# Patient Record
Sex: Female | Born: 1950 | Race: White | Hispanic: No | Marital: Married | Smoking: Never smoker
Health system: Southern US, Community
[De-identification: ages and names within clinical notes are randomized; demographics above are authoritative.]

## PROBLEM LIST (undated history)

## (undated) DIAGNOSIS — E079 Disorder of thyroid, unspecified: Secondary | ICD-10-CM

## (undated) DIAGNOSIS — F419 Anxiety disorder, unspecified: Secondary | ICD-10-CM

## (undated) DIAGNOSIS — G4733 Obstructive sleep apnea (adult) (pediatric): Secondary | ICD-10-CM

## (undated) DIAGNOSIS — Z8669 Personal history of other diseases of the nervous system and sense organs: Secondary | ICD-10-CM

## (undated) DIAGNOSIS — E785 Hyperlipidemia, unspecified: Secondary | ICD-10-CM

## (undated) DIAGNOSIS — Z86018 Personal history of other benign neoplasm: Secondary | ICD-10-CM

## (undated) DIAGNOSIS — I340 Nonrheumatic mitral (valve) insufficiency: Secondary | ICD-10-CM

## (undated) DIAGNOSIS — K219 Gastro-esophageal reflux disease without esophagitis: Secondary | ICD-10-CM

## (undated) DIAGNOSIS — I10 Essential (primary) hypertension: Secondary | ICD-10-CM

## (undated) DIAGNOSIS — E669 Obesity, unspecified: Secondary | ICD-10-CM

## (undated) HISTORY — DX: Gastro-esophageal reflux disease without esophagitis: K21.9

## (undated) HISTORY — DX: Personal history of other diseases of the nervous system and sense organs: Z86.69

## (undated) HISTORY — DX: Obstructive sleep apnea (adult) (pediatric): G47.33

## (undated) HISTORY — DX: Disorder of thyroid, unspecified: E07.9

## (undated) HISTORY — DX: Anxiety disorder, unspecified: F41.9

## (undated) HISTORY — DX: Essential (primary) hypertension: I10

## (undated) HISTORY — DX: Obesity, unspecified: E66.9

## (undated) HISTORY — DX: Hyperlipidemia, unspecified: E78.5

## (undated) HISTORY — DX: Nonrheumatic mitral (valve) insufficiency: I34.0

## (undated) HISTORY — DX: Personal history of other benign neoplasm: Z86.018

---

## 2004-01-02 ENCOUNTER — Ambulatory Visit: Payer: Self-pay | Admitting: Internal Medicine

## 2004-01-04 ENCOUNTER — Ambulatory Visit: Payer: Self-pay | Admitting: Obstetrics and Gynecology

## 2004-07-08 ENCOUNTER — Ambulatory Visit: Payer: Self-pay | Admitting: Internal Medicine

## 2004-10-24 ENCOUNTER — Ambulatory Visit: Payer: Self-pay | Admitting: Internal Medicine

## 2004-11-18 ENCOUNTER — Ambulatory Visit: Payer: Self-pay | Admitting: Internal Medicine

## 2005-01-01 ENCOUNTER — Ambulatory Visit: Payer: Self-pay | Admitting: General Surgery

## 2005-01-05 ENCOUNTER — Ambulatory Visit: Payer: Self-pay | Admitting: Obstetrics and Gynecology

## 2005-01-09 ENCOUNTER — Ambulatory Visit: Payer: Self-pay | Admitting: Obstetrics and Gynecology

## 2005-01-29 ENCOUNTER — Ambulatory Visit: Payer: Self-pay | Admitting: Internal Medicine

## 2005-05-26 ENCOUNTER — Ambulatory Visit: Payer: Self-pay | Admitting: Internal Medicine

## 2005-06-23 ENCOUNTER — Ambulatory Visit: Payer: Self-pay | Admitting: Obstetrics and Gynecology

## 2005-09-05 ENCOUNTER — Other Ambulatory Visit: Payer: Self-pay

## 2005-09-05 ENCOUNTER — Emergency Department: Payer: Self-pay | Admitting: Emergency Medicine

## 2006-02-26 ENCOUNTER — Ambulatory Visit: Payer: Self-pay | Admitting: Internal Medicine

## 2006-03-05 ENCOUNTER — Ambulatory Visit: Payer: Self-pay | Admitting: Internal Medicine

## 2006-03-12 ENCOUNTER — Ambulatory Visit: Payer: Self-pay | Admitting: Internal Medicine

## 2006-06-15 ENCOUNTER — Encounter: Payer: Self-pay | Admitting: Rheumatology

## 2006-07-04 ENCOUNTER — Encounter: Payer: Self-pay | Admitting: Rheumatology

## 2006-08-12 ENCOUNTER — Ambulatory Visit: Payer: Self-pay | Admitting: Internal Medicine

## 2006-11-02 ENCOUNTER — Ambulatory Visit: Payer: Self-pay | Admitting: Obstetrics and Gynecology

## 2007-03-03 ENCOUNTER — Ambulatory Visit: Payer: Self-pay | Admitting: Obstetrics and Gynecology

## 2007-03-10 ENCOUNTER — Ambulatory Visit: Payer: Self-pay | Admitting: Cardiology

## 2007-03-10 LAB — CONVERTED CEMR LAB: Pro B Natriuretic peptide (BNP): 8 pg/mL (ref 0.0–100.0)

## 2007-03-18 ENCOUNTER — Ambulatory Visit: Payer: Self-pay

## 2007-04-25 IMAGING — US US THYROID
1 series · 17 of 25 positions shown · non-contrast
Comparison: none

REASON FOR EXAM: Goiter
COMMENTS:

[Series 1: us thyroid · 17 of 28 slices shown]
[im 1/28]
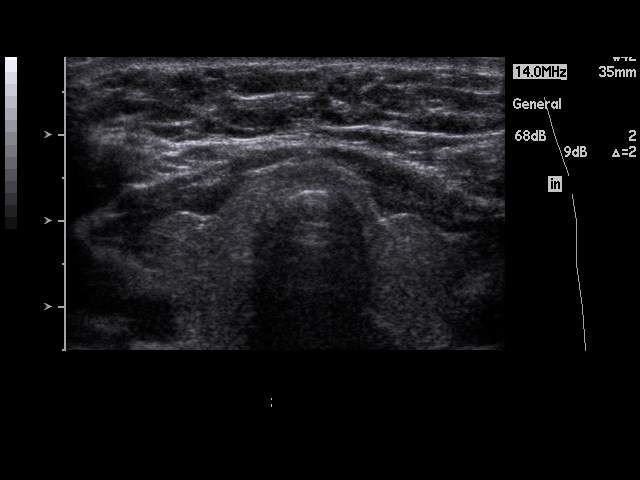
[im 3/28]
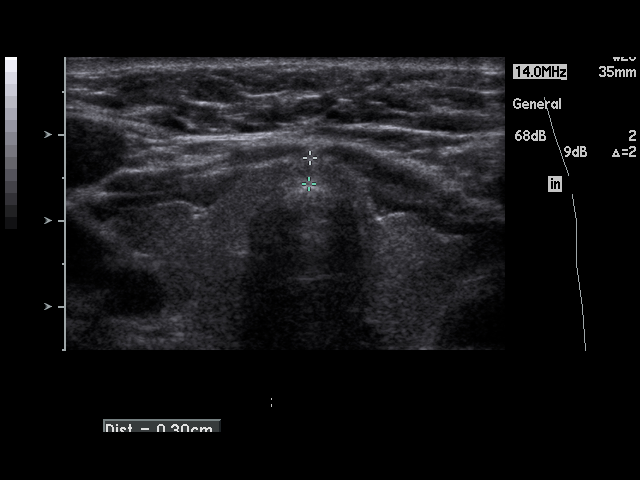
[im 4/28]
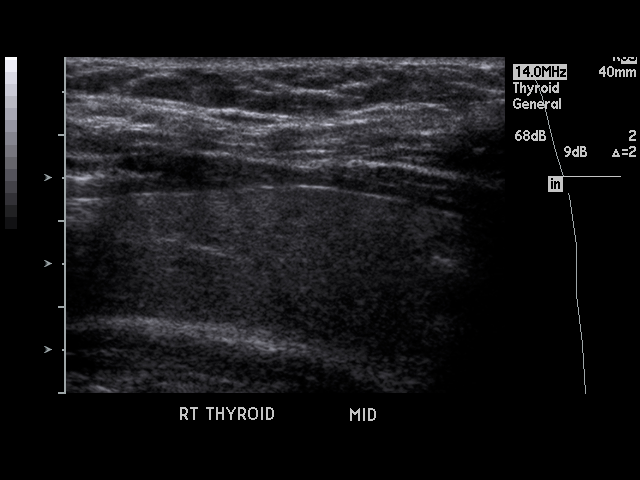
[im 6/28]
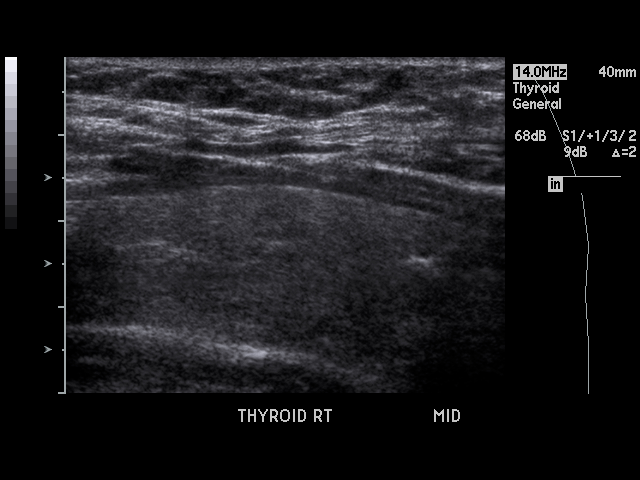
[im 7/28]
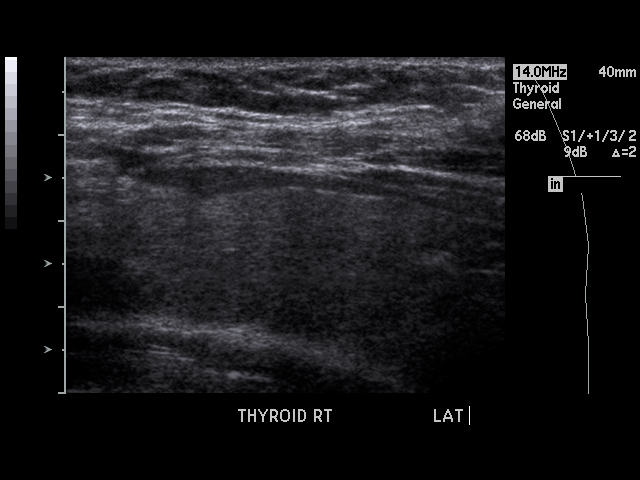
[im 10/28]
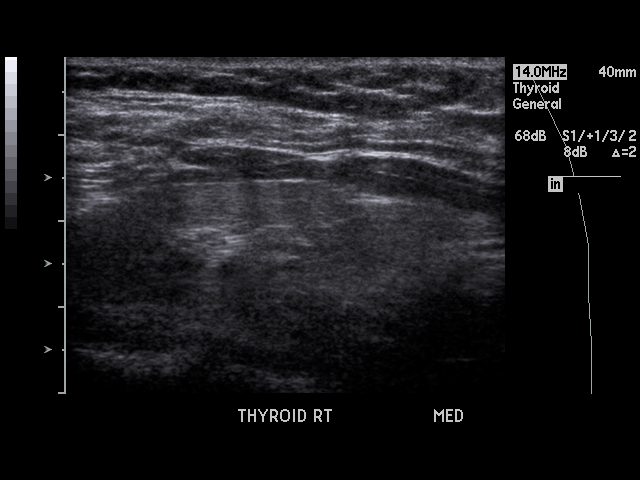
[im 11/28]
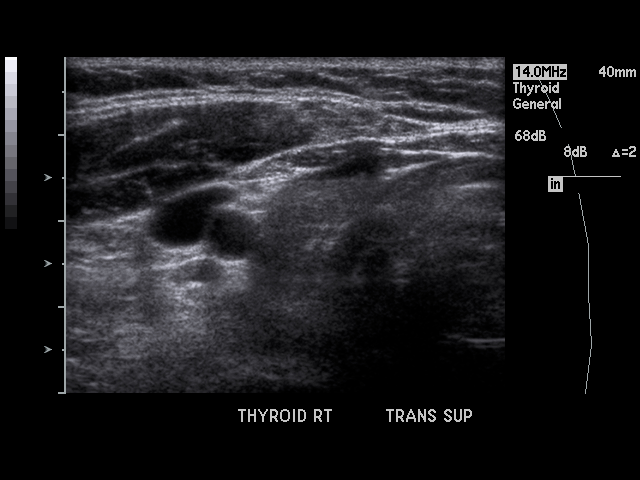
[im 13/28]
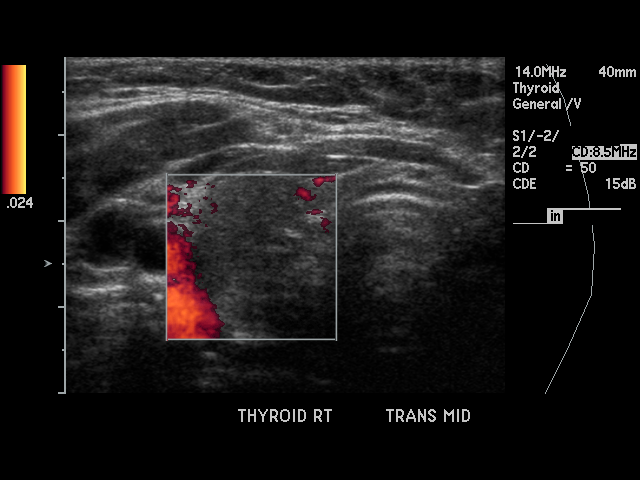
[im 14/28]
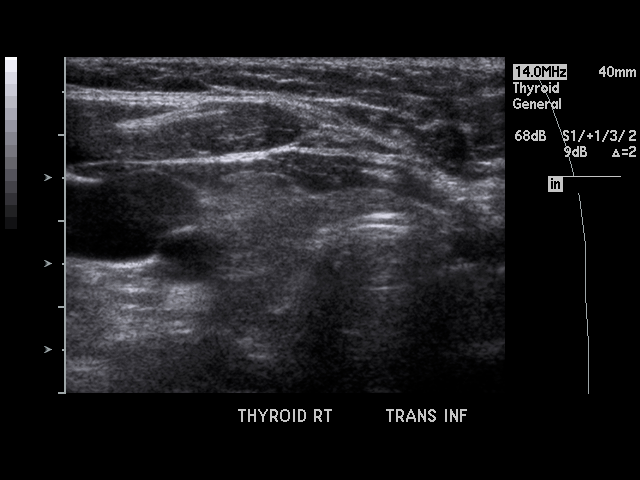
[im 15/28]
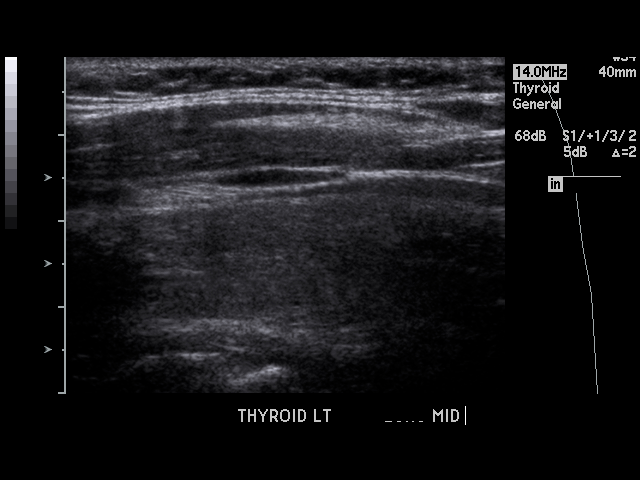
[im 17/28]
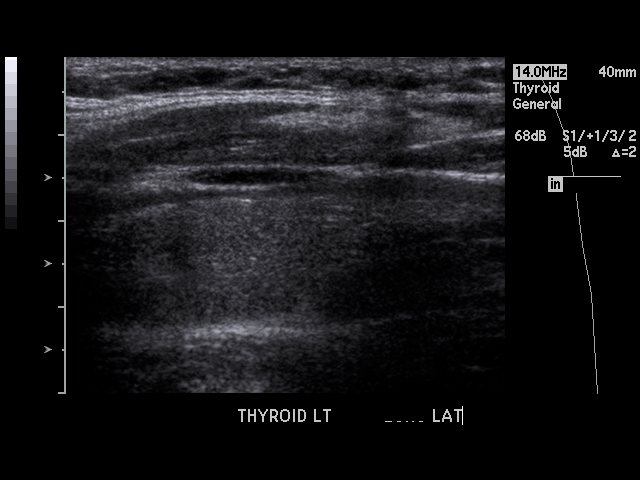
[im 19/28]
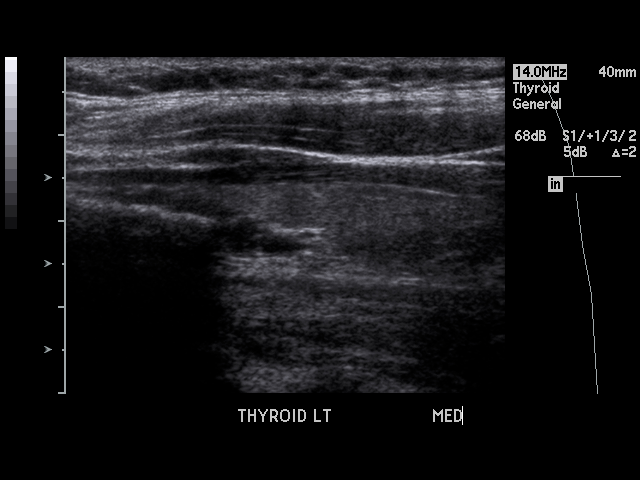
[im 21/28]
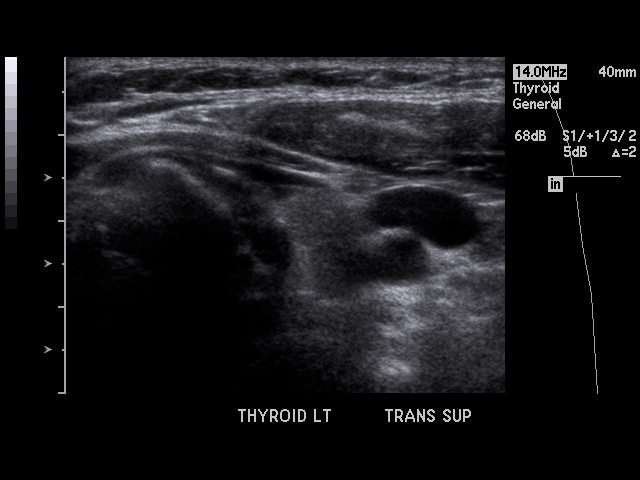
[im 22/28]
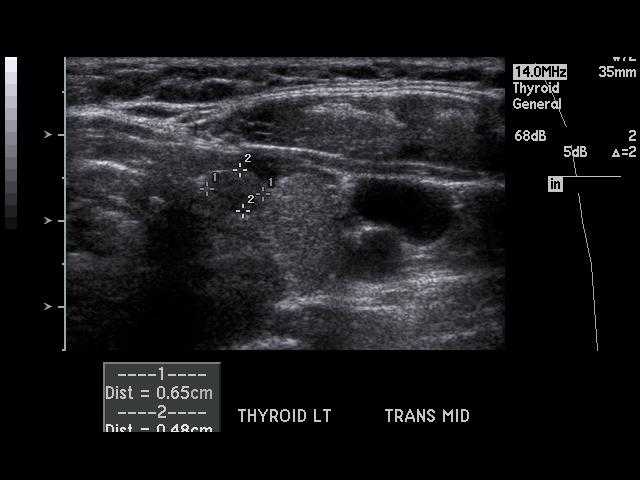
[im 24/28]
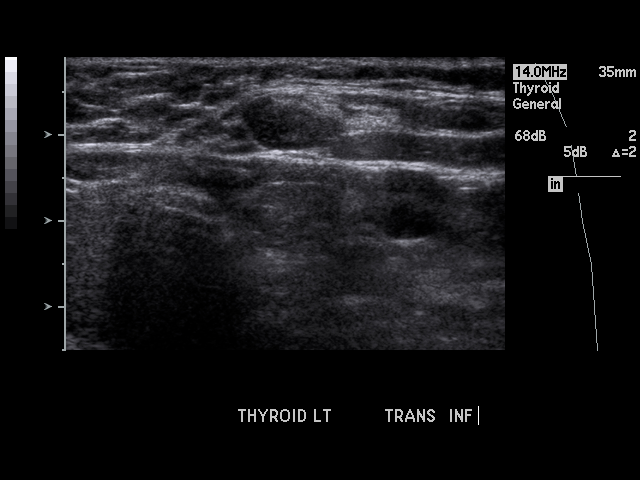
[im 25/28]
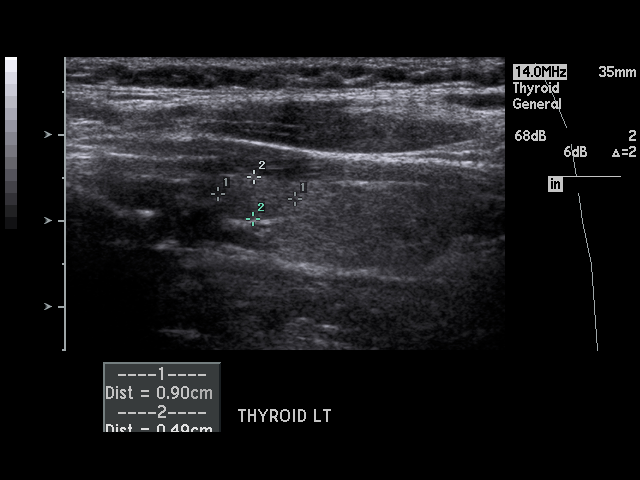
[im 28/28]
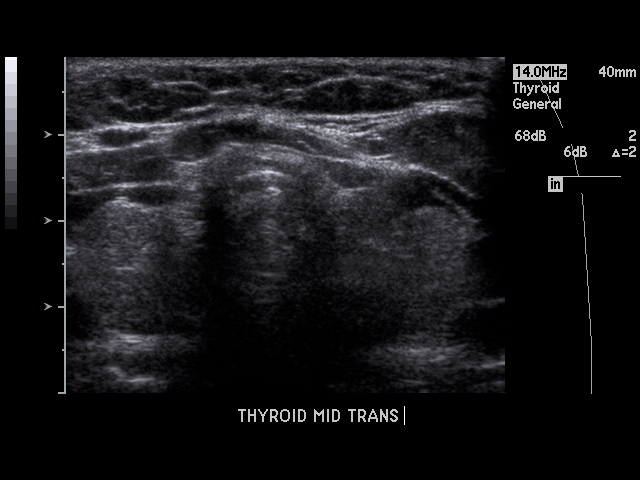

[17 of 25 positions shown; findings below may reference images not displayed]

PROCEDURE:     US  - US THYROID  - January 29, 2005  [DATE]

RESULT:     The RIGHT lobe of the thyroid measures 3.83 cm x 1.67 cm x
cm and the LEFT lobe measures 3.97 cm x 1.50 cm x 1.48 cm.  There is a 9-mm
hypoechoic solid nodule at the upper inner aspect of the LEFT lobe.  No
other nodules are identified.  The echo pattern of the thyroid lobes and
isthmus is homogeneous.
IMPRESSION: There is a 9-mm solid nodule at the upper pole of the LEFT lobe.

Otherwise normal study.

## 2007-08-25 ENCOUNTER — Ambulatory Visit: Payer: Self-pay | Admitting: Nurse Practitioner

## 2007-08-27 ENCOUNTER — Emergency Department: Payer: Self-pay | Admitting: Emergency Medicine

## 2007-11-25 ENCOUNTER — Ambulatory Visit: Payer: Self-pay | Admitting: Cardiovascular Disease

## 2007-11-29 ENCOUNTER — Ambulatory Visit: Payer: Self-pay

## 2007-12-26 ENCOUNTER — Ambulatory Visit: Payer: Self-pay | Admitting: Obstetrics and Gynecology

## 2008-01-30 ENCOUNTER — Ambulatory Visit: Payer: Self-pay | Admitting: Obstetrics and Gynecology

## 2008-02-02 ENCOUNTER — Ambulatory Visit: Payer: Self-pay | Admitting: Internal Medicine

## 2008-05-16 ENCOUNTER — Ambulatory Visit: Payer: Self-pay | Admitting: Internal Medicine

## 2008-06-17 ENCOUNTER — Emergency Department: Payer: Self-pay | Admitting: Emergency Medicine

## 2008-06-17 ENCOUNTER — Emergency Department (HOSPITAL_BASED_OUTPATIENT_CLINIC_OR_DEPARTMENT_OTHER): Admission: EM | Admit: 2008-06-17 | Discharge: 2008-06-18 | Payer: Self-pay | Admitting: Emergency Medicine

## 2008-06-18 ENCOUNTER — Ambulatory Visit: Payer: Self-pay | Admitting: Interventional Radiology

## 2008-07-03 ENCOUNTER — Observation Stay (HOSPITAL_COMMUNITY): Admission: AD | Admit: 2008-07-03 | Discharge: 2008-07-04 | Payer: Self-pay | Admitting: Cardiology

## 2008-07-03 ENCOUNTER — Encounter: Payer: Self-pay | Admitting: Emergency Medicine

## 2008-07-03 ENCOUNTER — Ambulatory Visit: Payer: Self-pay | Admitting: Interventional Radiology

## 2008-07-03 ENCOUNTER — Telehealth: Payer: Self-pay | Admitting: Cardiovascular Disease

## 2008-07-03 ENCOUNTER — Ambulatory Visit: Payer: Self-pay | Admitting: Cardiology

## 2008-07-05 ENCOUNTER — Telehealth: Payer: Self-pay | Admitting: Cardiovascular Disease

## 2008-07-11 ENCOUNTER — Telehealth: Payer: Self-pay | Admitting: Cardiovascular Disease

## 2008-07-12 DIAGNOSIS — E039 Hypothyroidism, unspecified: Secondary | ICD-10-CM | POA: Insufficient documentation

## 2008-07-12 DIAGNOSIS — K219 Gastro-esophageal reflux disease without esophagitis: Secondary | ICD-10-CM

## 2008-07-12 DIAGNOSIS — R079 Chest pain, unspecified: Secondary | ICD-10-CM

## 2008-07-12 DIAGNOSIS — I080 Rheumatic disorders of both mitral and aortic valves: Secondary | ICD-10-CM | POA: Insufficient documentation

## 2008-07-12 DIAGNOSIS — G4733 Obstructive sleep apnea (adult) (pediatric): Secondary | ICD-10-CM

## 2008-07-12 DIAGNOSIS — E785 Hyperlipidemia, unspecified: Secondary | ICD-10-CM | POA: Insufficient documentation

## 2008-07-12 DIAGNOSIS — I10 Essential (primary) hypertension: Secondary | ICD-10-CM

## 2008-07-26 ENCOUNTER — Encounter: Payer: Self-pay | Admitting: Cardiovascular Disease

## 2008-07-26 ENCOUNTER — Ambulatory Visit: Payer: Self-pay | Admitting: Cardiovascular Disease

## 2008-07-30 LAB — CONVERTED CEMR LAB
Calcium: 9.7 mg/dL (ref 8.4–10.5)
Glucose, Bld: 96 mg/dL (ref 70–99)
Sodium: 137 meq/L (ref 135–145)

## 2009-02-26 ENCOUNTER — Ambulatory Visit: Payer: Self-pay

## 2009-04-05 ENCOUNTER — Ambulatory Visit: Payer: Self-pay | Admitting: Cardiovascular Disease

## 2009-04-05 ENCOUNTER — Encounter: Payer: Self-pay | Admitting: Cardiovascular Disease

## 2009-04-05 ENCOUNTER — Telehealth: Payer: Self-pay | Admitting: Cardiovascular Disease

## 2009-04-06 LAB — CONVERTED CEMR LAB
ALT: 14 units/L (ref 0–35)
CO2: 27 meq/L (ref 19–32)
Chloride: 99 meq/L (ref 96–112)
Sodium: 139 meq/L (ref 135–145)
Total Bilirubin: 0.3 mg/dL (ref 0.3–1.2)
Total Protein: 7 g/dL (ref 6.0–8.3)

## 2009-09-30 ENCOUNTER — Telehealth: Payer: Self-pay | Admitting: Cardiovascular Disease

## 2009-10-08 ENCOUNTER — Telehealth: Payer: Self-pay | Admitting: Cardiovascular Disease

## 2009-10-08 DIAGNOSIS — R002 Palpitations: Secondary | ICD-10-CM

## 2009-10-11 ENCOUNTER — Telehealth (INDEPENDENT_AMBULATORY_CARE_PROVIDER_SITE_OTHER): Payer: Self-pay | Admitting: *Deleted

## 2009-11-02 ENCOUNTER — Telehealth (INDEPENDENT_AMBULATORY_CARE_PROVIDER_SITE_OTHER): Payer: Self-pay | Admitting: *Deleted

## 2009-11-29 ENCOUNTER — Ambulatory Visit: Payer: Self-pay | Admitting: Cardiovascular Disease

## 2009-12-11 ENCOUNTER — Telehealth: Payer: Self-pay | Admitting: Cardiovascular Disease

## 2010-03-04 ENCOUNTER — Ambulatory Visit: Payer: Self-pay | Admitting: Obstetrics and Gynecology

## 2010-03-04 NOTE — Assessment & Plan Note (Signed)
Summary: ekg  Nurse Visit   Vital Signs:  Patient profile:   60 year old female Pulse rate:   82 / minute Pulse rhythm:   regular BP sitting:   130 / 72 Cuff size:   large  Allergies: 1)  ! Codeine  Orders Added: 1)  T-Comprehensive Metabolic Panel [[id number]]

## 2010-03-04 NOTE — Progress Notes (Signed)
Summary: Want results sent to pcp  Phone Note Call from Patient Call back at Home Phone [phone]   Caller: Patient Summary of Call: Pt would like her holter monitor results to be sent to Dr.Mark Hyacinth Meeker [phone] Initial call taken by: Judie Grieve,  December 11, 2009 4:05 PM  Follow-up for Phone Call        spoke w/pt, monitor faxed to Dr Hyacinth Meeker at [phone] Meredith Staggers, RN  December 11, 2009 4:09 PM

## 2010-03-04 NOTE — Progress Notes (Signed)
Summary: Holter moniter scheduled  Phone Note Outgoing Call Call back at [facility] Phone [phone]   Call placed by: Stanton Kidney, EMT-P,  October 11, 2009 3:51 PM Action Taken: Phone Call Completed Summary of Call: Holter monitor scheduled 10/30/09.

## 2010-03-04 NOTE — Progress Notes (Signed)
Summary: Skipped Beats  Phone Note Call from Patient Call back at Home Phone [phone]   Caller: Patient Reason for Call: Talk to Nurse Summary of Call: PT C/O OF HEART SKIPPING BEATS.  Initial call taken by: Edman Circle,  September 30, 2009 2:16 PM  Follow-up for Phone Call        I spoke with the pt and she is having skipped beats.  These are happening almost every day and are very random.  The pt does not notice any particular trigger for these skipped beats.  The pt does consume caffeine and has been under stress.  I made her aware that this can make palpitations worse.  I spoke with the pt about wearing a heart monitor.  The pt would like me to speak with Dr Excell Seltzer and see if he would order a heart monitor.    Follow-up by: Julieta Gutting, RN, BSN,  September 30, 2009 6:38 PM  Additional Follow-up for Phone Call Additional follow up Details #1::        Pt had similar symptoms in March - CMET at that time was normal. Recommend 24 hour holter monitor - will likely give empiric trial of Toprol XL but will review Holter first. Additional Follow-up by: Norva Karvonen, MD,  September 30, 2009 8:47 PM    Additional Follow-up for Phone Call Additional follow up Details #2::    I spoke with the pt and made her aware that Dr Excell Seltzer ordered a holter monitor.  The pt said her skipped beats have calmed down since yesterday.  The pt said she failed to mention that she has had a sinus infection and just started a Z-pak yesterday.  I asked her if she had taken any decongestents OTC and she had.  I made her aware that decongestents can cause palpitations and make her symptoms worse.  The pt would like to hold off on monitor at this time since her skipped beats have calmed down.  I asked her to call our office if she had any further problems and we could order monitor at that time.  Pt agreed with plan.  Follow-up by: Julieta Gutting, RN, BSN,  October 01, 2009 2:13 PM

## 2010-03-04 NOTE — Progress Notes (Signed)
Summary: Holter  Phone Note Outgoing Call Call back at [facility] Phone [phone]   Call placed by: Stanton Kidney, EMT-P,  November 02, 2009 12:52 PM Action Taken: Phone Call Completed Summary of Call: S/W Pt,... tried to R/S appt for 24 holter monitor, Pt advised she was not having symptoms as much and does not want to have the monitor at this time. Stanton Kidney, EMT-P  November 02, 2009 12:53 PM      Appended Document: Holter Pt aware of results by phone.  Monitor showed NSR with Rare PVCs.  I spoke with the pt about caffeine, stress and lack of sleep contributing to PVCs.

## 2010-03-04 NOTE — Procedures (Signed)
Summary: summary report  summary report   Imported By: Mirna Mires 12/09/2009 08:45:27  _____________________________________________________________________  External Attachment:    Type:   Image     Comment:   External Document

## 2010-03-04 NOTE — Progress Notes (Signed)
Summary: WOULD LIKE TO GET HOLTER MONITOR  Phone Note Call from Patient Call back at Home Phone [phone]   Caller: PT Reason for Call: Talk to Nurse Summary of Call: PT WANT TO TALK WITH LAUREN ABOUT GETTING HOLTER MONITOR PUT ON. SHE IS STILL HAVING SAME SYMPTOMS. Initial call taken by: Faythe Ghee,  October 08, 2009 3:46 PM  Follow-up for Phone Call        I spoke with the pt and she is having problems with her heart skipping again.  Order placed for 24 hour holter monitor. I made the pt aware that our office will contact her with an appt for monitor placement.  Follow-up by: Julieta Gutting, RN, BSN,  October 08, 2009 3:51 PM  New Problems: PALPITATIONS (ICD-785.1)   New Problems: PALPITATIONS (ICD-785.1)

## 2010-03-04 NOTE — Progress Notes (Signed)
Summary: irregular HR  Phone Note Call from Patient   Summary of Call: pts called to state that over the last several days she has had increasing frequency of irregular heart beats, esp. from 3-5pm.  no chest pain, sob, or other cardiac symptoms.  pt had stopped taking benicar hct for a couple of days because bp was running in the 100's sys.  pt concerned about HR.  please advise.  Initial call taken by: Charlena Cross, RN, BSN,  April 05, 2009 3:31 PM  Follow-up for Phone Call        pt called back to say that she had just had another episode.  pt in tears.  instructed pt that she could come in for ekg but that if she was not having symptoms at that time, it would not show anything.  CMET drawn r/t pt on diuretic and K supplement. Follow-up by: Charlena Cross, RN, BSN,  April 05, 2009 4:32 PM  Additional Follow-up for Phone Call Additional follow up Details #1::        Will review EKG and labs when they are available (please route them to me). We could try her on a beta blocker empirically if palps remain problematic. Additional Follow-up by: Norva Karvonen, MD,  April 06, 2009 2:40 PM    Additional Follow-up for Phone Call Additional follow up Details #2::    pt aware. Charlena Cross RN BSN

## 2010-05-12 LAB — CBC
HCT: 33 % — ABNORMAL LOW (ref 36.0–46.0)
Hemoglobin: 11.4 g/dL — ABNORMAL LOW (ref 12.0–15.0)
MCHC: 34.8 g/dL (ref 30.0–36.0)
MCHC: 34.4 g/dL (ref 30.0–36.0)
MCV: 87.3 fL (ref 78.0–100.0)
Platelets: 279 10*3/uL (ref 150–400)
Platelets: 224 K/uL (ref 150–400)
RBC: 4.4 MIL/uL (ref 3.87–5.11)
RBC: 3.78 MIL/uL — ABNORMAL LOW (ref 3.87–5.11)
RDW: 12 % (ref 11.5–15.5)
RDW: 12.4 % (ref 11.5–15.5)
WBC: 6 K/uL (ref 4.0–10.5)

## 2010-05-12 LAB — HEMOGLOBIN A1C
Hgb A1c MFr Bld: 5.5 % (ref 4.6–6.1)
Mean Plasma Glucose: 111 mg/dL

## 2010-05-12 LAB — BASIC METABOLIC PANEL
BUN: 22 mg/dL (ref 6–23)
CO2: 30 mEq/L (ref 19–32)
Calcium: 9.6 mg/dL (ref 8.4–10.5)
Creatinine, Ser: 1.1 mg/dL (ref 0.4–1.2)
GFR calc Af Amer: >60 mL/min (ref >60)

## 2010-05-12 LAB — LIPID PANEL
HDL: 28 mg/dL — ABNORMAL LOW
Total CHOL/HDL Ratio: 6.3 ratio
Triglycerides: 185 mg/dL — ABNORMAL HIGH
VLDL: 37 mg/dL (ref 0–40)

## 2010-05-12 LAB — CARDIAC PANEL(CRET KIN+CKTOT+MB+TROPI)
Relative Index: INVALID (ref 0.0–2.5)
Total CK: 66 U/L (ref 7–177)
Troponin I: 0.01 ng/mL (ref 0.00–0.06)

## 2010-05-12 LAB — PROTIME-INR
INR: 1 (ref 0.00–1.49)
Prothrombin Time: 13.6 seconds (ref 11.6–15.2)

## 2010-05-12 LAB — POCT CARDIAC MARKERS: Myoglobin, poc: 64.1 ng/mL (ref 12–200)

## 2010-05-12 LAB — BRAIN NATRIURETIC PEPTIDE: Pro B Natriuretic peptide (BNP): 34 pg/mL (ref 0.0–100.0)

## 2010-06-17 NOTE — Assessment & Plan Note (Signed)
[facility] OFFICE NOTE   KIASHA, [name]                        MRN:          [id number]  DATE:11/25/2007                            DOB:          [birth year]    REASON FOR VISIT:  Lower extremity peripheral arterial disease and leg  pain.   HISTORY OF PRESENT ILLNESS:  Ms. Corrigan is a 60 year old woman who has  a history of chronic lower extremity edema.  She underwent vascular  screening at [facility] earlier this week and was  found to have abnormal ABIs.  They were reported at 0.51 on the right  and 0.82 on the left.  She presents today for further evaluation  regarding her concern of lower extremity peripheral arterial disease.   She reports a long-standing history of bilateral calf pain with walking  up hill.  This is a problem that has been slowly progressive over  several years.  She is able to walk on flat ground at a normal pace  without problems.  She denies any pain with that level of activity.  She  has a sedentary job and sits for much of the day.  She has had no  ulcerations or nonhealing wounds on the legs.  She also complains of  chronic leg edema, left greater than right.  This has been slowly  progressive over 15 years.  She wears compression stockings and has had  some improvement with those measures.  She also elevates her legs in the  evening.  She has no history of stroke, myocardial infarction, or  documented cardiovascular disease.   Current medications include:  1. Aspirin 162 mg daily.  2. Benicar HCT 20/12.5 mg one-half daily.  3. CPAP at night.  4. Lipitor 40 mg at bedtime.   Allergies include PAXIL, CODEINE, and DICLOFENAC.   Past medical history includes the following:  1. Dyslipidemia.  2. Obesity.  3. Sleep apnea.  4. Essential hypertension.  5. Headaches.  6. Long-standing edema.  7. Arthritis.   PAST SURGICAL HISTORY:  None.   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  She is married with 3  children.  She lives locally in [place].  She has no history of  tobacco use.  She drinks alcohol on rare occasion.  She is not  physically active.   REVIEW OF SYSTEMS:  Complete review of systems was performed.  No  pertinent positives to report.   FAMILY HISTORY:  Strong history of cardiovascular disease.  Father had  myocardial infarction at age 30, brother had a myocardial infarction in  his 10s.  She has a sister who has had coronary bypass surgery in her  30s.   PHYSICAL EXAMINATION:  GENERAL:  The patient is alert and oriented,  obese woman in no acute distress.  VITAL SIGNS:  Weight is 209 pounds, blood pressure 136/80, heart rate  98, respiratory rate 12.  HEENT:  Normal.  NECK:  Normal carotid upstrokes.  No bruits.  JVP normal.  LUNGS:  Clear bilaterally.  HEART:  Regular rate and rhythm.  No murmurs or gallops.  Apex is  discrete, nondisplaced.  ABDOMEN:  Soft, obese, nontender.  No organomegaly.  No bruits.  EXTREMITIES:  No clubbing, cyanosis, or edema.  Femoral pulses are 2+.  Dorsalis pedis pulses are 2+.  I cannot palpate posterior tibial pulses.  SKIN:  Warm and dry.  No rash.  NEUROLOGIC:  Cranial nerves II through XII are intact.  Strength intact  and equal.   EKG shows sinus rhythm and is within normal limits with a heart rate of  98 beats per minute.   ABIs as detailed above, 0.51 on the right, 0.82 on the left.   ASSESSMENT:  This is a 60 year old woman with a background of  hypertension and hyperlipidemia who presents for evaluation of abnormal  screening ABIs.  Her physical exam is suggestive of normal arterial  flow.  She has strong pedal pulses in the dorsalis pedis position.  I am  skeptical of her screening ABI results.  We will do formal ABIs in the  office with an arterial physiologic study.  If these are  abnormal, we  would recommend proceeding with lower extremity duplex scan.  I  reassured the patient regarding her vascular status.  We will make final  recommendations based on the results of her formal ABI and physiologic  study.  Certainly, she warrant some aggressive risk reduction measures  in the setting of her strong family history.  Her hypertension and  hypercholesterolemia are being treated aggressively by Dr. Hyacinth Meeker.  She  is on a good dose of a statin medication, and her blood pressure appears  to be well controlled on the combination of ACE inhibitor and diuretic.  She also is on antiplatelet therapy with aspirin.  I made no  recommendations for any changes in her medications.   We will plan on seeing Ms. Oliveto back on a p.r.n. basis unless her  ABIs show abnormal findings.  I appreciate the opportunity to see this  very nice patient.     Veverly Fells. Excell Seltzer, MD  Electronically Signed    MDC/MedQ  DD: 11/25/2007  DT: 11/26/2007  Job #: [id number]   cc:   Bethann Punches
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
## 2010-06-17 NOTE — Discharge Summary (Signed)
[name], [name] NO.:  [id number]   MEDICAL RECORD NO.:  [id number]          PATIENT TYPE:  INP   LOCATION:  2011                         FACILITY:  [facility]   PHYSICIAN:  Veverly Fells. Excell Seltzer, MD  DATE OF BIRTH:  26-Sep-1950   DATE OF ADMISSION:  07/03/2008  DATE OF DISCHARGE:  07/04/2008                               DISCHARGE SUMMARY   PRIMARY CARDIOLOGIST:  Veverly Fells. Excell Seltzer, MD   DISCHARGE DIAGNOSIS:  Noncardiac chest pain.   SECONDARY DIAGNOSES:  1. Hypertension.  2. Likely hypothyroidism (TSH equal to 8.011).  3. Dyslipidemia.  4. Gastroesophageal reflux disease.  5. Mild mitral regurgitation per echo in 2010.  6. Obstructive sleep apnea, on CPAP.   ALLERGIES:  1. DICLOFENAC.  2. CODEINE.  3. PAROXETINE.   PROCEDURES PERFORMED DURING THIS HOSPITALIZATION:  1. EKG showing sinus rhythm at a rate of 93, minimal T wave inversion      in V1 and T wave flattening in V2 and V3 as well as aVL.      Otherwise, no acute ST - T wave changes, no significant Q waves,      normal axis, no evidence of hypertrophy.  PR 176, QRS 74, and QTc      443.  2. Chest x-ray completed on July 03, 2008, showing no active disease.  3. The patient had EKG on July 04, 2008, with no significant changes      from prior tracing, and the patient had cardiac catheterization on      July 04, 2008, showing normal coronary arteries and a normal left      ventricular function.   HISTORY OF PRESENT ILLNESS:  [name] is a 60 year old Caucasian  female with no known history of coronary artery disease, but history  significant for hypertension, dyslipidemia, GERD, presenting to the  [facility] Emergency Department with complaint of chest pain.  Previously, seen by Dr. Excell Seltzer for lower extremity edema.  Recently, she  reports shortness of breath, and had a 2-D echocardiogram completed,  which only showed mild mitral regurgitation.  She reports having a  nuclear stress test 2 years ago,  reportedly normal.  Around 10:30 in  a.m. on July 03, 2008, the patient developed substernal chest pain over  the left breast, onset at rest, described as sharp with radiation to  left shoulder blade and left arm.  This was intermittent for  approximately 2 hours resolving on its own.  By the time, she was  evaluated in the ER, she was pain free, currently denying shortness of  breath, nausea, or diaphoresis (at any time).   HOSPITAL COURSE:  The patient admitted and underwent procedures as  described above.  She tolerated them well without any significant  complications.  The patient underwent cardiac catheterization secondary  to ongoing symptoms, significant family history, and severe anxiety  regarding symptomatology.  The patient's vital signs remained stable  during her brief hospital course except for mild hypotension on the  morning of July 04, 2008.  At that time, she was asymptomatic and her  nitroglycerin was discontinued.  Hypertension  subsequently resolved.  Unfortunately, the patient symptoms did return later in the a.m.   After postcath orders were completed, the patient was deemed stable for  discharge.  Secondary to normal findings, no medication changes were  made and no further cardiac workup is needed at this time.  The patient  will follow up with Dr. Excell Seltzer on June 24, at 11:45 a.m.  The patient  will receive her medication list (unchanged), followup instructions, and  postcath instructions at the time of discharge.  All her questions or  concerns will be addressed at that time.   The patient was encouraged to follow up with primary care [name]  regarding elevated TSH.   DISCHARGE LABORATORIES:  WBC 6.0, HGB 11.4, HCT 33.0, and PLT count 224.  Protime 13.6 and INR 1.0.  Hemoglobin A1c 5.5.  The patient had 1 set of  negative point-of-care markers and 3 full sets of cardiac enzymes that  were negative.  BNP was 34.0.  Total cholesterol 177, triglycerides 185,   HDL 28, LDL 112, and VLDL 37.  TSH was 8.011.   FOLLOWUP PLANS AND APPOINTMENTS:  See hospital course.   DISCHARGE MEDICATIONS:  1. Klor-Con 10 mEq p.o. daily.  2. Lipitor 20 mg p.o. daily.  3. Torsemide 10 mg p.o. daily.  4. Benicar 20 mg p.o. daily and HCTZ 12.5 mg p.o. daily.  5. Diazepam 10 mg p.o. daily p.r.n. for anxiety.   DURATION OF DISCHARGE/ENCOUNTER INCLUDING PHYSICIAN TIME:  35 minutes.      Jarrett Ables, [facility]      Veverly Fells. Excell Seltzer, MD  Electronically Signed    MS/MEDQ  D:  07/04/2008  T:  07/05/2008  Job:  [id number]

## 2010-06-17 NOTE — Assessment & Plan Note (Signed)
[facility] OFFICE NOTE   [name], [name]                        MRN:          [id number]  DATE:03/10/2007                            DOB:          01-06-1951    The primary is Dr. Bethann Punches.   REASON FOR PRESENTATION:  Evaluate patient with arm discomfort,  shortness of breath and swelling.   HISTORY OF PRESENT ILLNESS:  The patient is a pleasant 60 year old white  female who has had chronic lower extremity swelling and what sounds like  more diffuse edema for some time.  She has seen Dr. Guinevere Scarlet.  There has  been a question of thoracic outlet syndrome.  There has also been a  proposed diagnosis of lower extremity lymphedema.  She sounds like she  has had a stress test a few years ago.  She had an echocardiogram a few  years ago.  These were apparently normal, though I do not have these  reports.   She presents now with shortness of breath.  This has been going on for  perhaps a year or more.  However, it is progressive.  She is now getting  short of breath with moderate activities.  She is not describing resting  shortness of breath.  She has not been describing PND or orthopnea.  She  has sleep apnea and sleeps with a CPAP machine and actually does well  with this.  She has had chronic lower extremity swelling and does not  think that has changed.  She does describe discomfort in her arm.  This  is new.  This has been going on for a couple of months.  This seems to  be getting worse.  She describes a discomfort that goes down into her  hands and to her nail beds.  It is both arms, left greater than right.  She does have some discomfort up into her jaw as well.  This happens at  rest.  It will last for only a few seconds.  It is not reproducible with  activity.  However, again, she is not particularly active other than  climbing stairs, sometimes with groceries.  It goes away spontaneously.  There is no  associated nausea, vomiting or diaphoresis.   PAST MEDICAL HISTORY:  1. Dependent edema (questionable lymphedema).  2. Questionable thoracic outlet syndrome.  3. Dyslipidemia times years.  4. Allergic rhinitis.  5. Sleep apnea.  6. Obesity.  7. Hypertension x6 years.  8. Migraines.  9. Inflammatory arthritis.   PAST SURGICAL HISTORY:  None.   ALLERGIES:  Intolerances to Paxil, codeine, diclofenac.   MEDICATIONS:  1. Aspirin 162 mg daily.  2. Benicar/HCT 20/12.5 mg 1/2 tablet daily.  3. CPAP.  4. Lescol 80 mg nightly  5. Valium 5 mg p.r.n.  6. Celexa.  7. Ibuprofen.  8. Furosemide 20 mg p.r.n.   SOCIAL HISTORY:  The patient is a Museum/gallery exhibitions officer.  She likes  swimming.  She has three children living.  She has one deceased child.   FAMILY HISTORY:  Contributory for her mother alive with a  bypass.  She  has a brother with heart disease with a myocardial function at age 18  and sister with a myocardial infarction at age 41.   REVIEW OF SYSTEMS:  As stated in HPI and otherwise positive for  lightheadedness sometimes during the day, weakness, cough, occasional  palpitations, low back pain, left rotator cuff pain.  Negative for other  systems.   PHYSICAL EXAMINATION:  The patient is well-appearing, in no distress.  Blood pressure 142/66, heart rate 88 and regular, weight 206 pounds,  body mass index 39.  HEENT:  Eyelids unremarkable.  Pupils equal, round and reactive to  light, fundi not visualized, oral mucosa unremarkable.  NECK:  No jugular distention at 45 degrees, carotid upstroke brisk and  symmetrical.  No bruits, no thyromegaly.  LYMPHATICS:  No cervical, axillary or inguinal adenopathy.  LUNGS:  Clear to auscultation bilaterally.  BACK:  No costovertebral angle tenderness.  CHEST:  Unremarkable.  HEART:  PMI not displaced or sustained, S1 and S2 within normal limits.  No S3, no S4.  No clicks, no rubs, no murmurs.  ABDOMEN:  Obese, positive bowel sounds,  normal in frequency and pitch.  No bruits, no rebound, no guarding, no midline pulsatile mass.  No  hepatomegaly, no splenomegaly.  SKIN:  No rashes, no nodules.  EXTREMITIES:  2+ pulses throughout, no edema, no cyanosis, no clubbing.  NEUROLOGIC:  Oriented to person, place and time.  Cranial nerves II-XII  grossly intact.  Motor grossly intact.   ASSESSMENT/PLAN:  1. Arm discomfort and dyspnea.  The arm discomfort is atypical.  The      dyspnea could be multifactorial.  However, she has a very strong      family history.  I think she needs to be screened with a stress      perfusion study as the pretest probability is somewhat moderate for      obstructive coronary disease..  I will also check a BMP level.  If      this is very low, then the chance that her pulp her breathing      disorder is related to a cardiac etiology is low.  At that point I      might suggest cardiopulmonary stress testing (CPX) versus pulmonary      function testing to evaluate her dyspnea.  2. Lower extremity edema.  This has apparently been evaluated with an      echocardiogram.  I do not suspect a cardiac etiology or even      necessarily a primary venous problem.  It is probably related to      weight.  The BNP will help sort this out.  She does abuse salt      apparently.  I told her this is absolutely the first thing she      needs to do to see if her edema resolves.  Hopefully, she can      comply with this.  3. Obesity.  This certainly is contributing to multiple complaints.      Weight loss will be fundamental.  4. Sleep apnea per Dr. Hyacinth Meeker.  She seems to be compliant with her      CPAP.  5. Hypertension.  Blood pressure is slightly elevated.  She self-      medicates a little bit with doses of her drugs.  She reports good      blood pressure control.  6. Dyslipidemia.  Her LDL was 180.  She is due to  see Dr. Hyacinth Meeker.  I      suspect he will change her medicines.     Rollene Rotunda, MD, [facility]   Electronically Signed    JH/MedQ  DD: 03/10/2007  DT: 03/11/2007  Job #: [id number]   cc:   Bethann Punches

## 2010-06-17 NOTE — H&P (Signed)
[name], [name] NO.:  [id number]   MEDICAL RECORD NO.:  [id number]          PATIENT TYPE:  INP   LOCATION:  2011                         FACILITY:  [facility]   PHYSICIAN:  Armanda Magic, M.D.     DATE OF BIRTH:  Aug 20, 1950   DATE OF ADMISSION:  07/03/2008  DATE OF DISCHARGE:                              HISTORY & PHYSICAL   CHIEF COMPLAINT:  Chest pain.   HISTORY OF PRESENT ILLNESS:  This is a 60 year old female with a history  of hypertension, dyslipidemia, GERD who presents to the emergency room  complaints of chest pain.  She has been seen by Dr. Excell Seltzer in the past  for lower extremity edema.  Recently she has been having shortness of  breath and a 2-D echocardiogram showed mild MR.  She had a nuclear  stress test 2 years ago, which reportedly was normal.  She says around  10:30 this morning while sitting down typing, she developed substernal  chest pain over the left breast, described as sharp with radiation to  her left shoulder blade and left arm.  This was off and on for 2 hours  and resolved on its own.  By the time she got to the ER, she was pain  free.  She denied any shortness of breath, nausea, or diaphoresis.   PAST MEDICAL HISTORY:  1. Hypertension.  2. Dyslipidemia.  3. GERD.  4. Mild MR by echo in 2010.  5. Obstructive sleep apnea using CPAP.   PAST SURGICAL HISTORY:  None.   SOCIAL HISTORY:  She is married.  She has 3 living children, alive and  well.  She has 1 son who died at 38 of an MVA.  She denies any tobacco or  alcohol use.   FAMILY HISTORY:  Her mother died of CAD.  Her father died of CAD and  renal failure.  She has 2 sisters one of which had CABG in late 44.  She  has 2 brothers, one of which had an MI at 25.   MEDICATIONS:  1. Lipitor 20 mg a day.  2. Aspirin 81 mg 2 tablets daily.  3. Torsemide 10 mg daily.  4. Potassium 10 mEq a day.  5. Benicar, she has recently stopped.  6. Lunesta 2 mg at bedtime and p.r.n.  7.  CPAP.   REVIEW OF SYSTEMS:  Other that what is stated in the HPI is negative  except for episodic jaw pain.   PHYSICAL EXAMINATION:  VITAL SIGNS:  Blood pressure is 143/82, heart  rate 87, she is afebrile.  GENERAL:  She is a well-developed, well-nourished obese white female in  no acute distress.  HEENT:  Benign.  NECK:  Supple without lymphadenopathy.  Carotid upstrokes +2  bilaterally.  No bruits.  LUNGS:  Clear to auscultation throughout.  HEART:  Regular rate and rhythm.  No murmurs, rubs, or gallops.  Normal  S1 and S2.  ABDOMEN:  Soft, nontender, and nontender.  Normoactive bowel sounds.  No  hepatosplenomegaly.  EXTREMITIES:  Edema +1 bilaterally.   EKG shows normal sinus rhythm with nonspecific ST  abnormality.   LABORATORY DATA:  Sodium 143, potassium 3.7, chloride 100, CO2 of 30,  BUN 22, creatinine 1.1.  White cell count 6.7, hemoglobin 13.3,  hematocrit 38, platelet count 279.  CPK-MB less than 1, troponin less  than 0.05.  Myoglobin 64.  Chest x-ray shows no active disease.   IMPRESSION:  1. Chest pain syndrome with atypical and typical components.  Initial      point of care markers are negative.  EKG is nonischemic.  She is      currently pain free.  2. Hypertension, which is stable.  3. Gastroesophageal reflux disease.  4. Dyslipidemia.  5. Mild mitral regurgitation.  6. Lower extremity edema.   PLAN:  Admit to telemetry bed.  Rule out MI with serial cardiac enzymes.  Subcu Lovenox per pharmacy.  IV nitroglycerin drip.  N.p.o. after  midnight.  Nuclear stress test in a.m.  If enzymes negative, D-dimer and  BNP will be checked tonight.      Armanda Magic, M.D.  Electronically Signed     TT/MEDQ  D:  07/03/2008  T:  07/04/2008  Job:  [id number]   cc:   Veverly Fells. Excell Seltzer, MD

## 2010-07-07 ENCOUNTER — Telehealth: Payer: Self-pay | Admitting: Cardiovascular Disease

## 2010-07-07 ENCOUNTER — Telehealth: Payer: Self-pay | Admitting: *Deleted

## 2010-07-07 NOTE — Telephone Encounter (Signed)
Pt calls b/c at least 1 time a week around 3-4 am. " I massage my carotids and it goes away". She does not experience this during the day.  She will reduce her caffeine intake. Pt also states that sometimes "it is like a quiver".  She is wearing her cpap machine.  No other symptoms noted.   She would like Dr. Excell Seltzer to know.   Mylo Red RN

## 2010-07-07 NOTE — Telephone Encounter (Signed)
Patient wake up around 3-4 in am - C /O rapid heart beat,

## 2010-07-09 NOTE — Telephone Encounter (Signed)
Error in telephone call Mylo Red RN

## 2010-07-10 ENCOUNTER — Telehealth: Payer: Self-pay | Admitting: Cardiovascular Disease

## 2010-07-10 NOTE — Telephone Encounter (Signed)
Patient has been waking up during the night around 3-4 am with palpitations. She does wear a CPAP machine @ night. She hasn't had a sleep study in about 5-6 years. She does not have a full face mask. She was diaphoretic during one of these episodes with tachycardia. She did some deep breathing with carotid massage that lasted about 2 minutes. During the day his BP was 120/72 HR 95. She denies caffeine. I will route this to Dr. Excell Seltzer for his opinion.

## 2010-07-10 NOTE — Telephone Encounter (Signed)
Pt was having arrythmia's that wake her up and she called a couple of days ago and she still has not heard anything so she was calling to f/u

## 2010-07-11 NOTE — Telephone Encounter (Signed)
The patient has not been seen here in 2 years. She did have a Holter monitor last year which was unremarkable. She probably needs a followup visit for full evaluation.

## 2010-07-11 NOTE — Telephone Encounter (Signed)
I spoke with the pt and made her aware that Dr Excell Seltzer would like her to schedule a follow-up appointment.  The pt said she would call back once she had her calendar and make an appointment.  I did make her aware that at this time Dr Earmon Phoenix first available is 07/29/10 or she can see our PA in the office.

## 2010-07-21 ENCOUNTER — Encounter: Payer: Self-pay | Admitting: Cardiovascular Disease

## 2010-07-31 ENCOUNTER — Encounter: Payer: Self-pay | Admitting: Physician Assistant

## 2010-07-31 ENCOUNTER — Ambulatory Visit (INDEPENDENT_AMBULATORY_CARE_PROVIDER_SITE_OTHER): Payer: 59 | Admitting: Physician Assistant

## 2010-07-31 DIAGNOSIS — I10 Essential (primary) hypertension: Secondary | ICD-10-CM

## 2010-07-31 DIAGNOSIS — G4730 Sleep apnea, unspecified: Secondary | ICD-10-CM

## 2010-07-31 DIAGNOSIS — K219 Gastro-esophageal reflux disease without esophagitis: Secondary | ICD-10-CM

## 2010-07-31 DIAGNOSIS — R002 Palpitations: Secondary | ICD-10-CM

## 2010-07-31 LAB — BASIC METABOLIC PANEL
BUN: 21 mg/dL (ref 6–23)
Chloride: 100 mEq/L (ref 96–112)
Creatinine, Ser: 0.8 mg/dL (ref 0.4–1.2)
GFR: 74.02 mL/min (ref >60.00)
Glucose, Bld: 97 mg/dL (ref 70–99)

## 2010-07-31 LAB — MAGNESIUM: Magnesium: 2.3 mg/dL (ref 1.5–2.5)

## 2010-07-31 NOTE — Assessment & Plan Note (Signed)
With her history of hypertension and sleep apnea, I would be concerned that she may have been having paroxysms of atrial fibrillation.  I recommended that she proceed with a 2-D echocardiogram and an event monitor.  We will also get a basic metabolic panel, magnesium and TSH today.  She will followup with Dr. Excell Seltzer in 6-8 weeks.

## 2010-07-31 NOTE — Assessment & Plan Note (Signed)
Controlled.  Continue current therapy.  

## 2010-07-31 NOTE — Assessment & Plan Note (Addendum)
She has a symptom of dysphagia.  She has been advised to followup with her PCP.  She may need to see a gastroenterologist.

## 2010-07-31 NOTE — Progress Notes (Signed)
History of Present Illness: Primary Cardiologist: Dr. Tonny Bollman  [name] is a 60 y.o. female who underwent cardiac cath in 07/2008 to evaluate chest pain. She has a background of multiple CV risk factors, most notable for a strong family history of premature CAD.  Cardiac cath showed minimal CAD (essentially normal coronaries) with normal LV function.  She presents today with complaints of palpitations that occurred about 2 weeks ago.  They awoke her from sleep.  It felt like her heart was vibrating.  She actually did a Valsalva maneuver with resolution of her symptoms.  This happened on a couple of occasions.  Her husband told her to change the face mask that she used for her CPAP. After doing this, she has not had any recurrence of symptoms.  She thought that she may have been a little short of breath with this.  Otherwise she denies any chest pain.  She has had some left jaw pain.  This is nonexertional.  She denies orthopnea or PND.  She has chronic pedal edema.  She denies syncope.  She does note an elevated heart rate when she got in the heat.   Past Medical History  Diagnosis Date  . Chest pain     Cardiac catheterization 6/10: Normal, EF 65%  . Mitral regurgitation     mild  . Hypertension     mixed  . Obstructive sleep apnea   . GERD (gastroesophageal reflux disease)   . Hypothyroidism     Current Outpatient Prescriptions  Medication Sig Dispense Refill  . aspirin 81 MG tablet Take 162 mg by mouth daily.        Marland Kitchen atorvastatin (LIPITOR) 40 MG tablet Take 20 mg by mouth daily.       . diazepam (VALIUM) 10 MG tablet Take 2.5 mg by mouth every 6 (six) hours as needed.        . potassium chloride (KLOR-CON) 10 MEQ CR tablet Take 10 mEq by mouth daily.        Marland Kitchen torsemide (DEMADEX) 10 MG tablet Take 10 mg by mouth daily.        Marland Kitchen DISCONTD: olmesartan-hydrochlorothiazide (BENICAR HCT) 20-12.5 MG per tablet Take 0.5 tablets by mouth daily.          Allergies: Allergies    Allergen Reactions  . Codeine   . Diflucan     dizzy    Social history:  Nonsmoker  ROS:  See history of present illness.  She denies fevers, chills, cough, melena, hematochezia.  She does report a history of dysphagia.  She also reports a history of leg pain.  She notes constipation.  All other systems reviewed and negative.  Vital Signs: BP 112/68  Pulse 87  Ht 5\' 2"  (1.575 m)  Wt 209 lb (94.802 kg)  BMI 38.23 kg/m2  PHYSICAL EXAM: Well nourished, well developed, in no acute distress HEENT: normal Neck: no JVD Endocrine: No thyromegaly Vascular: No carotid bruits Cardiac:  normal S1, S2; RRR; no murmur Lungs:  clear to auscultation bilaterally, no wheezing, rhonchi or rales Abd: soft, nontender, no hepatomegaly Ext: Trace to 1+ nonpitting edema bilaterally Skin: warm and dry Neuro:  CNs 2-12 intact, no focal abnormalities noted  EKG:  Sinus rhythm, heart rate 87, normal axis, no acute changes  ASSESSMENT AND PLAN:

## 2010-07-31 NOTE — Patient Instructions (Addendum)
Your physician recommends that you schedule a follow-up appointment in: 6-8 weeks with Dr. Excell Seltzer as per [facility], PA-C.  Your physician recommends that you return for lab work in: TODAY BMET, MAGNESIUM, TSH 785.1  Your physician has requested that you have an echocardiogram 785.1 PT WANTS TO HAVE THIS DONE AFTER SHE RETURNS FROM VACATION, THIS IS OK AS PER SCOTT WEAVER, PA-C. Echocardiography is a painless test that uses sound waves to create images of your heart. It provides your doctor with information about the size and shape of your heart and how well your heart's chambers and valves are working. This procedure takes approximately one hour. There are no restrictions for this procedure.  Your physician has recommended that you wear an event monitor 785.1 PT WANTS TO HAVE THIS DONE AFTER SHE RETURNS FROM VACATION, THIS IS OK PER SCOTT WEAVER, PA-C. Event monitors are medical devices that record the heart's electrical activity. Doctors most often [place] these monitors to diagnose arrhythmias. Arrhythmias are problems with the speed or rhythm of the heartbeat. The monitor is a small, portable device. You can wear one while you do your normal daily activities. This is usually used to diagnose what is causing palpitations/syncope (passing out).   FOLLOW UP YOUR PRIMARY CARE PHYSICIAN FOR TROUBLE SWALLOWING

## 2010-08-01 ENCOUNTER — Ambulatory Visit (HOSPITAL_COMMUNITY): Payer: 59 | Attending: Cardiovascular Disease | Admitting: Radiology

## 2010-08-01 DIAGNOSIS — E669 Obesity, unspecified: Secondary | ICD-10-CM | POA: Insufficient documentation

## 2010-08-01 DIAGNOSIS — I079 Rheumatic tricuspid valve disease, unspecified: Secondary | ICD-10-CM | POA: Insufficient documentation

## 2010-08-01 DIAGNOSIS — I059 Rheumatic mitral valve disease, unspecified: Secondary | ICD-10-CM | POA: Insufficient documentation

## 2010-08-01 DIAGNOSIS — R002 Palpitations: Secondary | ICD-10-CM

## 2010-09-01 ENCOUNTER — Encounter: Payer: Self-pay | Admitting: Orthopedic Surgery

## 2010-09-01 ENCOUNTER — Encounter: Payer: Self-pay | Admitting: Cardiovascular Disease

## 2010-09-02 ENCOUNTER — Encounter (INDEPENDENT_AMBULATORY_CARE_PROVIDER_SITE_OTHER): Payer: 59

## 2010-09-02 DIAGNOSIS — R002 Palpitations: Secondary | ICD-10-CM

## 2010-09-03 ENCOUNTER — Encounter: Payer: Self-pay | Admitting: Orthopedic Surgery

## 2010-09-24 ENCOUNTER — Telehealth: Payer: Self-pay | Admitting: Cardiovascular Disease

## 2010-09-24 NOTE — Telephone Encounter (Signed)
Patient has been having palpitations with left arm pain. She has also had intermittent jaw pain.This pain comes & goes. She is currently wearing an event monitor. This mainly happens during the night. She does wear a CPAP machine at night. I reviewed her event monitor and did not see any indication for her palpitations. She has one more week to wear the monitor. Instructed her to wear the monitor at all times & to decrease her intake of caffeine. She is due to f/u with Dr. Excell Seltzer on 10/02/10. Tereso Newcomer, PA recently saw Ms. Eskelson in June & ordered an echo and the monitor. She would like his opinion regarding these symptoms. I will route to him since Dr. Excell Seltzer is currently on vacation.

## 2010-09-24 NOTE — Telephone Encounter (Signed)
Patient calling having left arm pain for about a couples of months.  worse at night. Pt going to physical therapy.

## 2010-09-24 NOTE — Telephone Encounter (Signed)
Let me see her monitor. Tereso Newcomer, PA-C

## 2010-09-26 ENCOUNTER — Telehealth: Payer: Self-pay | Admitting: Physician Assistant

## 2010-09-26 MED ORDER — METOPROLOL SUCCINATE ER 25 MG PO TB24: 25.0000 mg | ORAL_TABLET | Freq: Every day | ORAL | Status: DC

## 2010-09-26 NOTE — Telephone Encounter (Signed)
Reviewed her monitor. So far, she has sinus rhythm and sinus tach. Echo was normal. Try Toprol XL 25 mg QD until follow up visit to see if this helps. Tereso Newcomer, PA-C

## 2010-09-26 NOTE — Telephone Encounter (Signed)
Spoke with pt, she is of scott's recommendations. She will call with any problems prior to appt [name]

## 2010-09-29 ENCOUNTER — Telehealth: Payer: Self-pay | Admitting: Cardiovascular Disease

## 2010-09-29 NOTE — Telephone Encounter (Signed)
Pt calling re new med, propal, starting taking Saturday, was having arm pain, severe at night, now pain has lessened and almost gone away, she wants to talk with the nurse re poss side effect of a clot

## 2010-09-29 NOTE — Telephone Encounter (Signed)
Pt calling stating she has been started on metoprolol and her arm pain has disappeared---will she be more vunerable to blood clots taking this med--advised there are no indications in PDR that toprol causes clot formation, so if it's making your arm pain better, keep taking--pt agrees--nt

## 2010-10-02 ENCOUNTER — Ambulatory Visit: Payer: 59 | Admitting: Cardiovascular Disease

## 2010-10-04 ENCOUNTER — Encounter: Payer: Self-pay | Admitting: Orthopedic Surgery

## 2010-10-17 ENCOUNTER — Ambulatory Visit: Payer: 59 | Admitting: Cardiovascular Disease

## 2010-10-23 ENCOUNTER — Ambulatory Visit (INDEPENDENT_AMBULATORY_CARE_PROVIDER_SITE_OTHER): Payer: 59 | Admitting: Cardiovascular Disease

## 2010-10-23 ENCOUNTER — Encounter: Payer: Self-pay | Admitting: Cardiovascular Disease

## 2010-10-23 VITALS — BP 110/62 | HR 97 | Ht 61.0 in | Wt 209.8 lb

## 2010-10-23 DIAGNOSIS — R002 Palpitations: Secondary | ICD-10-CM

## 2010-10-23 DIAGNOSIS — I10 Essential (primary) hypertension: Secondary | ICD-10-CM

## 2010-10-23 NOTE — Patient Instructions (Signed)
Your physician recommends that you schedule a follow-up appointment in 1 YEAR WITH DR Excell Seltzer   Your physician recommends that you continue on your current medications as directed. Please refer to the Current Medication list given to you today.

## 2010-10-24 ENCOUNTER — Encounter: Payer: Self-pay | Admitting: Cardiovascular Disease

## 2010-10-24 NOTE — Assessment & Plan Note (Signed)
Minimal symptoms on a long-acting beta blocker. Continue current management.

## 2010-10-24 NOTE — Assessment & Plan Note (Signed)
Excellent control on a combination of metoprolol, Benicar, and hydrochlorothiazide.

## 2010-10-24 NOTE — Progress Notes (Signed)
HPI:  This is a 60 year old woman with history of chest pain and palpitations presented for followup evaluation. The patient underwent cardiac catheterization in 2000 tan demonstrating normal coronary arteries and normal left ventricular function. Her left ventricular filling pressure was normal. She has had episodic palpitations and was started on metoprolol succinate last year. She is adnexal response to this medication and her palpitations have now nearly resolved. She denies recurrent chest pain or dyspnea. He has no complaints at this time and overall she feels well. She states that her heart rate generally runs in the 70s compared to before she started Toprol when it ran in the 90s most of the time.  Outpatient Encounter Prescriptions as of 10/23/2010  Medication Sig Dispense Refill  . aspirin 81 MG tablet Take 162 mg by mouth daily.        Marland Kitchen atorvastatin (LIPITOR) 40 MG tablet Take 20 mg by mouth daily.       . diazepam (VALIUM) 10 MG tablet Take 2.5 mg by mouth every 6 (six) hours as needed.        . metoprolol succinate (TOPROL XL) 25 MG 24 hr tablet Take 1 tablet (25 mg total) by mouth daily.  30 tablet  11  . olmesartan-hydrochlorothiazide (BENICAR HCT) 20-12.5 MG per tablet Take 0.5 tablets by mouth daily.        . potassium chloride (KLOR-CON) 10 MEQ CR tablet Take 10 mEq by mouth daily.        Marland Kitchen torsemide (DEMADEX) 10 MG tablet Take 10 mg by mouth daily.          Allergies  Allergen Reactions  . Codeine   . Diflucan     dizzy    Past Medical History  Diagnosis Date  . Chest pain     Cardiac catheterization 6/10: Normal, EF 65%  . Mitral regurgitation     mild  . Hypertension     mixed  . Obstructive sleep apnea   . GERD (gastroesophageal reflux disease)   . Hypothyroidism   . Hyperlipidemia     ROS: Negative except as per HPI  BP 110/62  Pulse 97  Ht 5\' 1"  (1.549 m)  Wt 209 lb 12.8 oz (95.165 kg)  BMI 39.64 kg/m2  PHYSICAL EXAM: Pt is alert and oriented,  overweight very pleasant woman inNAD HEENT: normal Neck: JVP - normal, carotids 2+= without bruits Lungs: CTA bilaterally CV: RRR without murmur or gallop Abd: soft, NT, Positive BS, no hepatomegaly Ext: no C/C/E, distal pulses intact and equal Skin: warm/dry no rash  EKG:  Normal sinus rhythm 97 beats per minute, within normal limits.  ASSESSMENT AND PLAN:

## 2010-10-30 ENCOUNTER — Telehealth: Payer: Self-pay | Admitting: Cardiovascular Disease

## 2010-10-30 NOTE — Telephone Encounter (Signed)
Per Dr Excell Seltzer the Toprol is a treatment for migraines and it should not be causing migraines. The pt's PCP is who manages her migraines.  I made her aware that she should follow-up with PCP if migraines continue to worsen.

## 2010-10-30 NOTE — Telephone Encounter (Signed)
Pt c/o more frequent migranes since taking toprol. Pt wanted to know if there was any kind of solution or why that could be happening. Please return pt call to discuss further.

## 2010-11-04 ENCOUNTER — Telehealth: Payer: Self-pay | Admitting: Cardiovascular Disease

## 2010-11-04 NOTE — Telephone Encounter (Signed)
Pt calling stating she has stopped taking her metoprolol due to severe H/A and migraines(also her insurance co. Won't pay for it)  --she also stated she has a wide variety of symptoms she forgot to tell you about which include--diaphoresis, left arm pain, excruciating pain in coccyx area--i believe she wants something to replace metoprolol, and what's the next step?--advised i would pass message along to dr cooper and lauren--pt agrees--nt

## 2010-11-04 NOTE — Telephone Encounter (Signed)
Pt calling re a lot of symptoms, needs to discuss with nurse

## 2010-11-05 NOTE — Telephone Encounter (Signed)
She was on metoprolol for palpitations. I would just stay off of it if she is having side effects. She has no CAD. If recurrent symptomatic palps could try diltiazem but would favor no meds for now.

## 2010-11-07 NOTE — Telephone Encounter (Signed)
I spoke with the pt and made her aware of Dr Cooper's recommendations.  

## 2010-11-07 NOTE — Telephone Encounter (Signed)
Left message for pt to call back  °

## 2010-12-30 ENCOUNTER — Telehealth: Payer: Self-pay | Admitting: Cardiovascular Disease

## 2010-12-30 NOTE — Telephone Encounter (Signed)
New Msg: Pt calling wanting results of pt holter monitor faxed over to Dr. Bethann Punches, pt PCP for his review.  Telephone: [phone]

## 2010-12-30 NOTE — Telephone Encounter (Signed)
This pt had an event monitor in August.  The monitor results would be in medical records. I will forward this message to Medical Records to follow-up on faxing results.

## 2010-12-30 NOTE — Telephone Encounter (Signed)
Holter Monitor Faxed to [facility] Dr.Miller @ [phone] 12/29/10/km

## 2011-01-12 ENCOUNTER — Ambulatory Visit: Payer: Self-pay | Admitting: Internal Medicine

## 2011-03-04 ENCOUNTER — Ambulatory Visit (INDEPENDENT_AMBULATORY_CARE_PROVIDER_SITE_OTHER): Payer: 59 | Admitting: Family Medicine

## 2011-03-04 ENCOUNTER — Encounter: Payer: Self-pay | Admitting: Family Medicine

## 2011-03-04 VITALS — BP 114/77 | HR 99 | Ht 62.0 in | Wt 209.0 lb

## 2011-03-04 DIAGNOSIS — N949 Unspecified condition associated with female genital organs and menstrual cycle: Secondary | ICD-10-CM

## 2011-03-04 DIAGNOSIS — N951 Menopausal and female climacteric states: Secondary | ICD-10-CM

## 2011-03-04 DIAGNOSIS — Z1272 Encounter for screening for malignant neoplasm of vagina: Secondary | ICD-10-CM

## 2011-03-04 DIAGNOSIS — Z780 Asymptomatic menopausal state: Secondary | ICD-10-CM | POA: Insufficient documentation

## 2011-03-04 DIAGNOSIS — Z809 Family history of malignant neoplasm, unspecified: Secondary | ICD-10-CM

## 2011-03-04 DIAGNOSIS — I080 Rheumatic disorders of both mitral and aortic valves: Secondary | ICD-10-CM

## 2011-03-04 DIAGNOSIS — Z01419 Encounter for gynecological examination (general) (routine) without abnormal findings: Secondary | ICD-10-CM

## 2011-03-04 DIAGNOSIS — N898 Other specified noninflammatory disorders of vagina: Secondary | ICD-10-CM

## 2011-03-04 DIAGNOSIS — R87619 Unspecified abnormal cytological findings in specimens from cervix uteri: Secondary | ICD-10-CM | POA: Insufficient documentation

## 2011-03-04 NOTE — Patient Instructions (Signed)

## 2011-03-04 NOTE — Progress Notes (Signed)
  Subjective:     [name] is a 61 y.o. female and is here for a comprehensive physical exam. The patient reports no problems.  Having some issues with vaginal odor.  Is post-menopausal.  No PMB.  Recent douching.  Also reports having GSI, needing a pad daily.  Doing Kegel's.  History   Social History  . Marital Status: Married    Spouse Name: N/A    Number of Children: N/A  . Years of Education: N/A   Occupational History  . Full Time    Social History Main Topics  . Smoking status: Never Smoker   . Smokeless tobacco: Not on file  . Alcohol Use: Yes     rare  . Drug Use: No  . Sexually Active: Yes -- Female partner(s)    Birth Control/ Protection: Post-menopausal   Other Topics Concern  . Not on file   Social History Narrative   Regular exercise: No   Health Maintenance  Topic Date Due  . Pap Smear  08/12/1970  . Tetanus/tdap  08/12/1971  . Mammogram  08/12/2002  . Colonoscopy  08/12/2002  . Influenza Vaccine  11/03/2010    The following portions of the patient's history were reviewed and updated as appropriate: allergies, current medications, past family history, past medical history, past social history, past surgical history and problem list.  Review of Systems A comprehensive review of systems was negative.   Objective:    BP 114/77  Pulse 99  Ht 5\' 2"  (1.575 m)  Wt 209 lb (94.802 kg)  BMI 38.23 kg/m2 General appearance: alert and cooperative Head: Normocephalic, without obvious abnormality, atraumatic Neck: no adenopathy, supple, symmetrical, trachea midline and thyroid not enlarged, symmetric, no tenderness/mass/nodules Lungs: clear to auscultation bilaterally Breasts: normal appearance, no masses or tenderness Heart: regular rate and rhythm, S1, S2 normal, no murmur, click, rub or gallop Abdomen: soft, non-tender; bowel sounds normal; no masses,  no organomegaly Pelvic: cervix normal in appearance, external genitalia normal, no adnexal masses or  tenderness, no cervical motion tenderness, uterus normal size, shape, and consistency and atrophic vagina Extremities: edema 2 + Pulses: 2+ and symmetric Skin: Skin color, texture, turgor normal. No rashes or lesions Lymph nodes: Cervical, supraclavicular, and axillary nodes normal. Neurologic: Grossly normal    Assessment:    Healthy female exam. Post menopausal.  Mild GSI  Family h/o Breast ca, ovarian ca.     Plan:     Pap smear Labs and mammo through PCP BRCA testing Wet prep Return if wants to do something definitive for GSI. See After Visit Summary for Counseling Recommendations

## 2011-03-05 LAB — WET PREP, GENITAL
Clue Cells Wet Prep HPF POC: NONE SEEN
WBC, Wet Prep HPF POC: NONE SEEN
Yeast Wet Prep HPF POC: NONE SEEN

## 2011-03-26 ENCOUNTER — Ambulatory Visit: Payer: Self-pay | Admitting: Internal Medicine

## 2011-05-07 ENCOUNTER — Telehealth: Payer: Self-pay | Admitting: Cardiovascular Disease

## 2011-05-07 NOTE — Telephone Encounter (Signed)
New Msg: Pt calling wanting to speak with nurse/MD about pt daughter and wanting pt to see MD. Pt is c/o PVC's during the day and at night they are continuous. Pt stated daughter probably has a little anxiety but wanted to speak with nurse/MD about pt daughter. Please return pt call to discuss further.

## 2011-05-07 NOTE — Telephone Encounter (Signed)
I spoke with the [name] and she is doing fine.  She called to ask questions about her daughter.  The [name]'s daughter is 62 and healthy.  She has been under a lot of stress at work.  For the past week she has been having palpitations.  She has not had any evaluation for her symptoms.  She does not have a PCP but does follow with GYN.  I made Calvin aware that her daughter should see the GYN or a PCP for evaluation is symptoms continue.  I made her aware that these symptoms can come from caffeine, stress, anxiety, lack of sleep, thyroid issues.

## 2012-04-05 ENCOUNTER — Ambulatory Visit: Payer: Self-pay | Admitting: Internal Medicine

## 2012-04-25 ENCOUNTER — Ambulatory Visit (INDEPENDENT_AMBULATORY_CARE_PROVIDER_SITE_OTHER): Payer: 59 | Admitting: Obstetrics & Gynecology

## 2012-04-25 ENCOUNTER — Encounter: Payer: Self-pay | Admitting: Obstetrics & Gynecology

## 2012-04-25 VITALS — BP 114/78 | HR 84 | Resp 16 | Ht 62.0 in | Wt 191.0 lb

## 2012-04-25 DIAGNOSIS — Z0000 Encounter for general adult medical examination without abnormal findings: Secondary | ICD-10-CM

## 2012-04-25 DIAGNOSIS — Z01419 Encounter for gynecological examination (general) (routine) without abnormal findings: Secondary | ICD-10-CM

## 2012-04-25 DIAGNOSIS — Z124 Encounter for screening for malignant neoplasm of cervix: Secondary | ICD-10-CM

## 2012-04-25 DIAGNOSIS — Z1151 Encounter for screening for human papillomavirus (HPV): Secondary | ICD-10-CM

## 2012-04-25 NOTE — Progress Notes (Signed)
Subjective:    [name] is a 62 y.o. female who presents for an annual exam. The patient has no complaints today.  The patient is sexually active. GYN screening history: last pap: was normal. The patient wears seatbelts: yes. The patient participates in regular exercise: yes. Has the patient ever been transfused or tattooed?: yes. The patient reports that there is not domestic violence in her life.   Menstrual History: OB History   Grav Para Term Preterm Abortions TAB SAB Ect Mult Living   4 4        3       Menarche age: 66  No LMP recorded. Patient is postmenopausal.    The following portions of the patient's history were reviewed and updated as appropriate: allergies, current medications, past family history, past medical history, past social history, past surgical history and problem list.  Review of Systems A comprehensive review of systems was negative.  Married for 38 years, denies dyspareunia, uses lubrication. 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 Mammogram UTD, s/p flu vaccine. She still has some GSUI but is not ready for surgical treatment.   Objective:    BP 114/78  Pulse 84  Resp 16  Ht 5\' 2"  (1.575 m)  Wt 191 lb (86.637 kg)  BMI 34.93 kg/m2  General Appearance:    Alert, cooperative, no distress, appears stated age  Head:    Normocephalic, without obvious abnormality, atraumatic  Eyes:    PERRL, conjunctiva/corneas clear, EOM's intact, fundi    benign, both eyes  Ears:    Normal TM's and external ear canals, both ears  Nose:   Nares normal, septum midline, mucosa normal, no drainage    or sinus tenderness  Throat:   Lips, mucosa, and tongue normal; teeth and gums normal  Neck:   Supple, symmetrical, trachea midline, no adenopathy;    thyroid:  no enlargement/tenderness/nodules; no carotid   bruit or JVD  Back:     Symmetric, no curvature, ROM normal, no CVA tenderness  Lungs:     Clear to auscultation bilaterally, respirations unlabored   Chest Wall:    No tenderness or deformity   Heart:    Regular rate and rhythm, S1 and S2 normal, no murmur, rub   or gallop  Breast Exam:    No tenderness, masses, or nipple abnormality  Abdomen:     Soft, non-tender, bowel sounds active all four quadrants,    no masses, no organomegaly  Genitalia:    Normal female without lesion, discharge or tenderness, 1st degree cystocele and uterine prolapse, normal adnexal exam     Extremities:   Extremities normal, atraumatic, no cyanosis or edema  Pulses:   2+ and symmetric all extremities  Skin:   Skin color, texture, turgor normal, no rashes or lesions  Lymph nodes:   Cervical, supraclavicular, and axillary nodes normal  Neurologic:   CNII-XII intact, normal strength, sensation and reflexes    throughout  .    Assessment:    Healthy female exam.    Plan:     Thin prep Pap smear.  HPV cotesting Referral to GI for colonoscopy

## 2013-03-06 ENCOUNTER — Encounter: Payer: Self-pay | Admitting: *Deleted

## 2013-05-02 ENCOUNTER — Ambulatory Visit: Payer: Self-pay | Admitting: General Surgery

## 2013-05-31 ENCOUNTER — Encounter: Payer: Self-pay | Admitting: *Deleted

## 2013-07-31 ENCOUNTER — Ambulatory Visit: Payer: Self-pay | Admitting: Internal Medicine

## 2013-09-12 ENCOUNTER — Ambulatory Visit (INDEPENDENT_AMBULATORY_CARE_PROVIDER_SITE_OTHER): Payer: 59 | Admitting: Family Medicine

## 2013-09-12 ENCOUNTER — Encounter: Payer: Self-pay | Admitting: Family Medicine

## 2013-09-12 VITALS — BP 112/74 | HR 90 | Ht 62.0 in | Wt 196.6 lb

## 2013-09-12 DIAGNOSIS — Z124 Encounter for screening for malignant neoplasm of cervix: Secondary | ICD-10-CM

## 2013-09-12 DIAGNOSIS — Z1151 Encounter for screening for human papillomavirus (HPV): Secondary | ICD-10-CM

## 2013-09-12 DIAGNOSIS — N39498 Other specified urinary incontinence: Secondary | ICD-10-CM

## 2013-09-12 DIAGNOSIS — Z01419 Encounter for gynecological examination (general) (routine) without abnormal findings: Secondary | ICD-10-CM

## 2013-09-12 DIAGNOSIS — N951 Menopausal and female climacteric states: Secondary | ICD-10-CM

## 2013-09-12 DIAGNOSIS — R32 Unspecified urinary incontinence: Secondary | ICD-10-CM | POA: Insufficient documentation

## 2013-09-12 DIAGNOSIS — Z780 Asymptomatic menopausal state: Secondary | ICD-10-CM

## 2013-09-12 NOTE — Patient Instructions (Signed)
Preventive Care for Adults A healthy lifestyle and preventive care can promote health and wellness. Preventive health guidelines for women include the following key practices.  A routine yearly physical is a good way to check with your health care provider about your health and preventive screening. It is a chance to share any concerns and updates on your health and to receive a thorough exam.  Visit your dentist for a routine exam and preventive care every 6 months. Brush your teeth twice a day and floss once a day. Good oral hygiene prevents tooth decay and gum disease.  The frequency of eye exams is based on your age, health, family medical history, use of contact lenses, and other factors. Follow your health care provider's recommendations for frequency of eye exams.  Eat a healthy diet. Foods like vegetables, fruits, whole grains, low-fat dairy products, and lean protein foods contain the nutrients you need without too many calories. Decrease your intake of foods high in solid fats, added sugars, and salt. Eat the right amount of calories for you.Get information about a proper diet from your health care provider, if necessary.  Regular physical exercise is one of the most important things you can do for your health. Most adults should get at least 150 minutes of moderate-intensity exercise (any activity that increases your heart rate and causes you to sweat) each week. In addition, most adults need muscle-strengthening exercises on 2 or more days a week.  Maintain a healthy weight. The body mass index (BMI) is a screening tool to identify possible weight problems. It provides an estimate of body fat based on height and weight. Your health care provider can find your BMI and can help you achieve or maintain a healthy weight.For adults 20 years and older:  A BMI below 18.5 is considered underweight.  A BMI of 18.5 to 24.9 is normal.  A BMI of 25 to 29.9 is considered overweight.  A BMI of  30 and above is considered obese.  Maintain normal blood lipids and cholesterol levels by exercising and minimizing your intake of saturated fat. Eat a balanced diet with plenty of fruit and vegetables. Blood tests for lipids and cholesterol should begin at age 76 and be repeated every 5 years. If your lipid or cholesterol levels are high, you are over 50, or you are at high risk for heart disease, you may need your cholesterol levels checked more frequently.Ongoing high lipid and cholesterol levels should be treated with medicines if diet and exercise are not working.  If you smoke, find out from your health care provider how to quit. If you do not use tobacco, do not start.  Lung cancer screening is recommended for adults aged 22-80 years who are at high risk for developing lung cancer because of a history of smoking. A yearly low-dose CT scan of the lungs is recommended for people who have at least a 30-pack-year history of smoking and are a current smoker or have quit within the past 15 years. A pack year of smoking is smoking an average of 1 pack of cigarettes a day for 1 year (for example: 1 pack a day for 30 years or 2 packs a day for 15 years). Yearly screening should continue until the smoker has stopped smoking for at least 15 years. Yearly screening should be stopped for people who develop a health problem that would prevent them from having lung cancer treatment.  If you are pregnant, do not drink alcohol. If you are breastfeeding,  be very cautious about drinking alcohol. If you are not pregnant and choose to drink alcohol, do not have more than 1 drink per day. One drink is considered to be 12 ounces (355 mL) of beer, 5 ounces (148 mL) of wine, or 1.5 ounces (44 mL) of liquor.  Avoid use of street drugs. Do not share needles with anyone. Ask for help if you need support or instructions about stopping the use of drugs.  High blood pressure causes heart disease and increases the risk of  stroke. Your blood pressure should be checked at least every 1 to 2 years. Ongoing high blood pressure should be treated with medicines if weight loss and exercise do not work.  If you are 3-86 years old, ask your health care provider if you should take aspirin to prevent strokes.  Diabetes screening involves taking a blood sample to check your fasting blood sugar level. This should be done once every 3 years, after age 67, if you are within normal weight and without risk factors for diabetes. Testing should be considered at a younger age or be carried out more frequently if you are overweight and have at least 1 risk factor for diabetes.  Breast cancer screening is essential preventive care for women. You should practice "breast self-awareness." This means understanding the normal appearance and feel of your breasts and may include breast self-examination. Any changes detected, no matter how small, should be reported to a health care provider. Women in their 8s and 30s should have a clinical breast exam (CBE) by a health care provider as part of a regular health exam every 1 to 3 years. After age 70, women should have a CBE every year. Starting at age 25, women should consider having a mammogram (breast X-ray test) every year. Women who have a family history of breast cancer should talk to their health care provider about genetic screening. Women at a high risk of breast cancer should talk to their health care providers about having an MRI and a mammogram every year.  Breast cancer gene (BRCA)-related cancer risk assessment is recommended for women who have family members with BRCA-related cancers. BRCA-related cancers include breast, ovarian, tubal, and peritoneal cancers. Having family members with these cancers may be associated with an increased risk for harmful changes (mutations) in the breast cancer genes BRCA1 and BRCA2. Results of the assessment will determine the need for genetic counseling and  BRCA1 and BRCA2 testing.  Routine pelvic exams to screen for cancer are no longer recommended for nonpregnant women who are considered low risk for cancer of the pelvic organs (ovaries, uterus, and vagina) and who do not have symptoms. Ask your health care provider if a screening pelvic exam is right for you.  If you have had past treatment for cervical cancer or a condition that could lead to cancer, you need Pap tests and screening for cancer for at least 20 years after your treatment. If Pap tests have been discontinued, your risk factors (such as having a new sexual partner) need to be reassessed to determine if screening should be resumed. Some women have medical problems that increase the chance of getting cervical cancer. In these cases, your health care provider may recommend more frequent screening and Pap tests.  The HPV test is an additional test that may be used for cervical cancer screening. The HPV test looks for the virus that can cause the cell changes on the cervix. The cells collected during the Pap test can be  tested for HPV. The HPV test could be used to screen women aged 30 years and older, and should be used in women of any age who have unclear Pap test results. After the age of 30, women should have HPV testing at the same frequency as a Pap test.  Colorectal cancer can be detected and often prevented. Most routine colorectal cancer screening begins at the age of 50 years and continues through age 75 years. However, your health care provider may recommend screening at an earlier age if you have risk factors for colon cancer. On a yearly basis, your health care provider may provide home test kits to check for hidden blood in the stool. Use of a small camera at the end of a tube, to directly examine the colon (sigmoidoscopy or colonoscopy), can detect the earliest forms of colorectal cancer. Talk to your health care provider about this at age 50, when routine screening begins. Direct  exam of the colon should be repeated every 5-10 years through age 75 years, unless early forms of pre-cancerous polyps or small growths are found.  People who are at an increased risk for hepatitis B should be screened for this virus. You are considered at high risk for hepatitis B if:  You were born in a country where hepatitis B occurs often. Talk with your health care provider about which countries are considered high risk.  Your parents were born in a high-risk country and you have not received a shot to protect against hepatitis B (hepatitis B vaccine).  You have HIV or AIDS.  You use needles to inject street drugs.  You live with, or have sex with, someone who has hepatitis B.  You get hemodialysis treatment.  You take certain medicines for conditions like cancer, organ transplantation, and autoimmune conditions.  Hepatitis C blood testing is recommended for all people born from 1945 through 1965 and any individual with known risks for hepatitis C.  Practice safe sex. Use condoms and avoid high-risk sexual practices to reduce the spread of sexually transmitted infections (STIs). STIs include gonorrhea, chlamydia, syphilis, trichomonas, herpes, HPV, and human immunodeficiency virus (HIV). Herpes, HIV, and HPV are viral illnesses that have no cure. They can result in disability, cancer, and death.  You should be screened for sexually transmitted illnesses (STIs) including gonorrhea and chlamydia if:  You are sexually active and are younger than 24 years.  You are older than 24 years and your health care provider tells you that you are at risk for this type of infection.  Your sexual activity has changed since you were last screened and you are at an increased risk for chlamydia or gonorrhea. Ask your health care provider if you are at risk.  If you are at risk of being infected with HIV, it is recommended that you take a prescription medicine daily to prevent HIV infection. This is  called preexposure prophylaxis (PrEP). You are considered at risk if:  You are a heterosexual woman, are sexually active, and are at increased risk for HIV infection.  You take drugs by injection.  You are sexually active with a partner who has HIV.  Talk with your health care provider about whether you are at high risk of being infected with HIV. If you choose to begin PrEP, you should first be tested for HIV. You should then be tested every 3 months for as long as you are taking PrEP.  Osteoporosis is a disease in which the bones lose minerals and strength   with aging. This can result in serious bone fractures or breaks. The risk of osteoporosis can be identified using a bone density scan. Women ages 65 years and over and women at risk for fractures or osteoporosis should discuss screening with their health care providers. Ask your health care provider whether you should take a calcium supplement or vitamin D to reduce the rate of osteoporosis.  Menopause can be associated with physical symptoms and risks. Hormone replacement therapy is available to decrease symptoms and risks. You should talk to your health care provider about whether hormone replacement therapy is right for you.  Use sunscreen. Apply sunscreen liberally and repeatedly throughout the day. You should seek shade when your shadow is shorter than you. Protect yourself by wearing long sleeves, pants, a wide-brimmed hat, and sunglasses year round, whenever you are outdoors.  Once a month, do a whole body skin exam, using a mirror to look at the skin on your back. Tell your health care provider of new moles, moles that have irregular borders, moles that are larger than a pencil eraser, or moles that have changed in shape or color.  Stay current with required vaccines (immunizations).  Influenza vaccine. All adults should be immunized every year.  Tetanus, diphtheria, and acellular pertussis (Td, Tdap) vaccine. Pregnant women should  receive 1 dose of Tdap vaccine during each pregnancy. The dose should be obtained regardless of the length of time since the last dose. Immunization is preferred during the 27th-36th week of gestation. An adult who has not previously received Tdap or who does not know her vaccine status should receive 1 dose of Tdap. This initial dose should be followed by tetanus and diphtheria toxoids (Td) booster doses every 10 years. Adults with an unknown or incomplete history of completing a 3-dose immunization series with Td-containing vaccines should begin or complete a primary immunization series including a Tdap dose. Adults should receive a Td booster every 10 years.  Varicella vaccine. An adult without evidence of immunity to varicella should receive 2 doses or a second dose if she has previously received 1 dose. Pregnant females who do not have evidence of immunity should receive the first dose after pregnancy. This first dose should be obtained before leaving the health care facility. The second dose should be obtained 4-8 weeks after the first dose.  Human papillomavirus (HPV) vaccine. Females aged 13-26 years who have not received the vaccine previously should obtain the 3-dose series. The vaccine is not recommended for use in pregnant females. However, pregnancy testing is not needed before receiving a dose. If a female is found to be pregnant after receiving a dose, no treatment is needed. In that case, the remaining doses should be delayed until after the pregnancy. Immunization is recommended for any person with an immunocompromised condition through the age of 26 years if she did not get any or all doses earlier. During the 3-dose series, the second dose should be obtained 4-8 weeks after the first dose. The third dose should be obtained 24 weeks after the first dose and 16 weeks after the second dose.  Zoster vaccine. One dose is recommended for adults aged 60 years or older unless certain conditions are  present.  Measles, mumps, and rubella (MMR) vaccine. Adults born before 1957 generally are considered immune to measles and mumps. Adults born in 1957 or later should have 1 or more doses of MMR vaccine unless there is a contraindication to the vaccine or there is laboratory evidence of immunity to   each of the three diseases. A routine second dose of MMR vaccine should be obtained at least 28 days after the first dose for students attending postsecondary schools, health care workers, or international travelers. People who received inactivated measles vaccine or an unknown type of measles vaccine during 1963-1967 should receive 2 doses of MMR vaccine. People who received inactivated mumps vaccine or an unknown type of mumps vaccine before 1979 and are at high risk for mumps infection should consider immunization with 2 doses of MMR vaccine. For females of childbearing age, rubella immunity should be determined. If there is no evidence of immunity, females who are not pregnant should be vaccinated. If there is no evidence of immunity, females who are pregnant should delay immunization until after pregnancy. Unvaccinated health care workers born before 1957 who lack laboratory evidence of measles, mumps, or rubella immunity or laboratory confirmation of disease should consider measles and mumps immunization with 2 doses of MMR vaccine or rubella immunization with 1 dose of MMR vaccine.  Pneumococcal 13-valent conjugate (PCV13) vaccine. When indicated, a person who is uncertain of her immunization history and has no record of immunization should receive the PCV13 vaccine. An adult aged 19 years or older who has certain medical conditions and has not been previously immunized should receive 1 dose of PCV13 vaccine. This PCV13 should be followed with a dose of pneumococcal polysaccharide (PPSV23) vaccine. The PPSV23 vaccine dose should be obtained at least 8 weeks after the dose of PCV13 vaccine. An adult aged 19  years or older who has certain medical conditions and previously received 1 or more doses of PPSV23 vaccine should receive 1 dose of PCV13. The PCV13 vaccine dose should be obtained 1 or more years after the last PPSV23 vaccine dose.  Pneumococcal polysaccharide (PPSV23) vaccine. When PCV13 is also indicated, PCV13 should be obtained first. All adults aged 65 years and older should be immunized. An adult younger than age 65 years who has certain medical conditions should be immunized. Any person who resides in a nursing home or long-term care facility should be immunized. An adult smoker should be immunized. People with an immunocompromised condition and certain other conditions should receive both PCV13 and PPSV23 vaccines. People with human immunodeficiency virus (HIV) infection should be immunized as soon as possible after diagnosis. Immunization during chemotherapy or radiation therapy should be avoided. Routine use of PPSV23 vaccine is not recommended for American Indians, [place] Natives, or people younger than 65 years unless there are medical conditions that require PPSV23 vaccine. When indicated, people who have unknown immunization and have no record of immunization should receive PPSV23 vaccine. One-time revaccination 5 years after the first dose of PPSV23 is recommended for people aged 19-64 years who have chronic kidney failure, nephrotic syndrome, asplenia, or immunocompromised conditions. People who received 1-2 doses of PPSV23 before age 65 years should receive another dose of PPSV23 vaccine at age 65 years or later if at least 5 years have passed since the previous dose. Doses of PPSV23 are not needed for people immunized with PPSV23 at or after age 65 years.  Meningococcal vaccine. Adults with asplenia or persistent complement component deficiencies should receive 2 doses of quadrivalent meningococcal conjugate (MenACWY-D) vaccine. The doses should be obtained at least 2 months apart.  Microbiologists working with certain meningococcal bacteria, military recruits, people at risk during an outbreak, and people who travel to or live in countries with a high rate of meningitis should be immunized. A first-year college student up through age   21 years who is living in a residence hall should receive a dose if she did not receive a dose on or after her 16th birthday. Adults who have certain high-risk conditions should receive one or more doses of vaccine.  Hepatitis A vaccine. Adults who wish to be protected from this disease, have certain high-risk conditions, work with hepatitis A-infected animals, work in hepatitis A research labs, or travel to or work in countries with a high rate of hepatitis A should be immunized. Adults who were previously unvaccinated and who anticipate close contact with an international adoptee during the first 60 days after arrival in the [place] from a country with a high rate of hepatitis A should be immunized.  Hepatitis B vaccine. Adults who wish to be protected from this disease, have certain high-risk conditions, may be exposed to blood or other infectious body fluids, are household contacts or sex partners of hepatitis B positive people, are clients or workers in certain care facilities, or travel to or work in countries with a high rate of hepatitis B should be immunized.  Haemophilus influenzae type b (Hib) vaccine. A previously unvaccinated person with asplenia or sickle cell disease or having a scheduled splenectomy should receive 1 dose of Hib vaccine. Regardless of previous immunization, a recipient of a hematopoietic stem cell transplant should receive a 3-dose series 6-12 months after her successful transplant. Hib vaccine is not recommended for adults with HIV infection. Preventive Services / Frequency Ages 64 to 68 years  Blood pressure check.** / Every 1 to 2 years.  Lipid and cholesterol check.** / Every 5 years beginning at age  22.  Clinical breast exam.** / Every 3 years for women in their 88s and 53s.  BRCA-related cancer risk assessment.** / For women who have family members with a BRCA-related cancer (breast, ovarian, tubal, or peritoneal cancers).  Pap test.** / Every 2 years from ages 90 through 51. Every 3 years starting at age 21 through age 56 or 3 with a history of 3 consecutive normal Pap tests.  HPV screening.** / Every 3 years from ages 24 through ages 1 to 46 with a history of 3 consecutive normal Pap tests.  Hepatitis C blood test.** / For any individual with known risks for hepatitis C.  Skin self-exam. / Monthly.  Influenza vaccine. / Every year.  Tetanus, diphtheria, and acellular pertussis (Tdap, Td) vaccine.** / Consult your health care provider. Pregnant women should receive 1 dose of Tdap vaccine during each pregnancy. 1 dose of Td every 10 years.  Varicella vaccine.** / Consult your health care provider. Pregnant females who do not have evidence of immunity should receive the first dose after pregnancy.  HPV vaccine. / 3 doses over 6 months, if 72 and younger. The vaccine is not recommended for use in pregnant females. However, pregnancy testing is not needed before receiving a dose.  Measles, mumps, rubella (MMR) vaccine.** / You need at least 1 dose of MMR if you were born in 1957 or later. You may also need a 2nd dose. For females of childbearing age, rubella immunity should be determined. If there is no evidence of immunity, females who are not pregnant should be vaccinated. If there is no evidence of immunity, females who are pregnant should delay immunization until after pregnancy.  Pneumococcal 13-valent conjugate (PCV13) vaccine.** / Consult your health care provider.  Pneumococcal polysaccharide (PPSV23) vaccine.** / 1 to 2 doses if you smoke cigarettes or if you have certain conditions.  Meningococcal vaccine.** /  1 dose if you are age 19 to 21 years and a first-year college  student living in a residence hall, or have one of several medical conditions, you need to get vaccinated against meningococcal disease. You may also need additional booster doses.  Hepatitis A vaccine.** / Consult your health care provider.  Hepatitis B vaccine.** / Consult your health care provider.  Haemophilus influenzae type b (Hib) vaccine.** / Consult your health care provider. Ages 40 to 64 years  Blood pressure check.** / Every 1 to 2 years.  Lipid and cholesterol check.** / Every 5 years beginning at age 20 years.  Lung cancer screening. / Every year if you are aged 55-80 years and have a 30-pack-year history of smoking and currently smoke or have quit within the past 15 years. Yearly screening is stopped once you have quit smoking for at least 15 years or develop a health problem that would prevent you from having lung cancer treatment.  Clinical breast exam.** / Every year after age 40 years.  BRCA-related cancer risk assessment.** / For women who have family members with a BRCA-related cancer (breast, ovarian, tubal, or peritoneal cancers).  Mammogram.** / Every year beginning at age 40 years and continuing for as long as you are in good health. Consult with your health care provider.  Pap test.** / Every 3 years starting at age 30 years through age 65 or 70 years with a history of 3 consecutive normal Pap tests.  HPV screening.** / Every 3 years from ages 30 years through ages 65 to 70 years with a history of 3 consecutive normal Pap tests.  Fecal occult blood test (FOBT) of stool. / Every year beginning at age 50 years and continuing until age 75 years. You may not need to do this test if you get a colonoscopy every 10 years.  Flexible sigmoidoscopy or colonoscopy.** / Every 5 years for a flexible sigmoidoscopy or every 10 years for a colonoscopy beginning at age 50 years and continuing until age 75 years.  Hepatitis C blood test.** / For all people born from 1945 through  1965 and any individual with known risks for hepatitis C.  Skin self-exam. / Monthly.  Influenza vaccine. / Every year.  Tetanus, diphtheria, and acellular pertussis (Tdap/Td) vaccine.** / Consult your health care provider. Pregnant women should receive 1 dose of Tdap vaccine during each pregnancy. 1 dose of Td every 10 years.  Varicella vaccine.** / Consult your health care provider. Pregnant females who do not have evidence of immunity should receive the first dose after pregnancy.  Zoster vaccine.** / 1 dose for adults aged 60 years or older.  Measles, mumps, rubella (MMR) vaccine.** / You need at least 1 dose of MMR if you were born in 1957 or later. You may also need a 2nd dose. For females of childbearing age, rubella immunity should be determined. If there is no evidence of immunity, females who are not pregnant should be vaccinated. If there is no evidence of immunity, females who are pregnant should delay immunization until after pregnancy.  Pneumococcal 13-valent conjugate (PCV13) vaccine.** / Consult your health care provider.  Pneumococcal polysaccharide (PPSV23) vaccine.** / 1 to 2 doses if you smoke cigarettes or if you have certain conditions.  Meningococcal vaccine.** / Consult your health care provider.  Hepatitis A vaccine.** / Consult your health care provider.  Hepatitis B vaccine.** / Consult your health care provider.  Haemophilus influenzae type b (Hib) vaccine.** / Consult your health care provider. Ages 65   years and over  Blood pressure check.** / Every 1 to 2 years.  Lipid and cholesterol check.** / Every 5 years beginning at age 22 years.  Lung cancer screening. / Every year if you are aged 73-80 years and have a 30-pack-year history of smoking and currently smoke or have quit within the past 15 years. Yearly screening is stopped once you have quit smoking for at least 15 years or develop a health problem that would prevent you from having lung cancer  treatment.  Clinical breast exam.** / Every year after age 4 years.  BRCA-related cancer risk assessment.** / For women who have family members with a BRCA-related cancer (breast, ovarian, tubal, or peritoneal cancers).  Mammogram.** / Every year beginning at age 40 years and continuing for as long as you are in good health. Consult with your health care provider.  Pap test.** / Every 3 years starting at age 9 years through age 34 or 91 years with 3 consecutive normal Pap tests. Testing can be stopped between 65 and 70 years with 3 consecutive normal Pap tests and no abnormal Pap or HPV tests in the past 10 years.  HPV screening.** / Every 3 years from ages 57 years through ages 64 or 45 years with a history of 3 consecutive normal Pap tests. Testing can be stopped between 65 and 70 years with 3 consecutive normal Pap tests and no abnormal Pap or HPV tests in the past 10 years.  Fecal occult blood test (FOBT) of stool. / Every year beginning at age 15 years and continuing until age 17 years. You may not need to do this test if you get a colonoscopy every 10 years.  Flexible sigmoidoscopy or colonoscopy.** / Every 5 years for a flexible sigmoidoscopy or every 10 years for a colonoscopy beginning at age 86 years and continuing until age 71 years.  Hepatitis C blood test.** / For all people born from [birth year] through 1963 and any individual with known risks for hepatitis C.  Osteoporosis screening.** / A one-time screening for women ages 83 years and over and women at risk for fractures or osteoporosis.  Skin self-exam. / Monthly.  Influenza vaccine. / Every year.  Tetanus, diphtheria, and acellular pertussis (Tdap/Td) vaccine.** / 1 dose of Td every 10 years.  Varicella vaccine.** / Consult your health care provider.  Zoster vaccine.** / 1 dose for adults aged 61 years or older.  Pneumococcal 13-valent conjugate (PCV13) vaccine.** / Consult your health care provider.  Pneumococcal  polysaccharide (PPSV23) vaccine.** / 1 dose for all adults aged 28 years and older.  Meningococcal vaccine.** / Consult your health care provider.  Hepatitis A vaccine.** / Consult your health care provider.  Hepatitis B vaccine.** / Consult your health care provider.  Haemophilus influenzae type b (Hib) vaccine.** / Consult your health care provider. ** Family history and personal history of risk and conditions may change your health care provider's recommendations. Document Released: 03/17/2001 Document Revised: 06/05/2013 Document Reviewed: 06/16/2010 [facility] Patient Information 2015 [place], [place]. This information is not intended to replace advice given to you by your health care provider. Make sure you discuss any questions you have with your health care provider.

## 2013-09-12 NOTE — Assessment & Plan Note (Signed)
Advised of problems with starting HRT this far from menopause.  Advised current guidelines, recommend for only 5 years and close to menopause.  Risks of CAD, CVA and Breast ca discussed--similarly not a great candidate for testosterone for the same reasons. Discussed that evidence of improvement of sex drive is not good with either anyway.

## 2013-09-12 NOTE — Progress Notes (Signed)
Subjective:     [name] is a 63 y.o. female and is here for a comprehensive physical exam. The patient reports problems - pain in right breast x 1 month.  Present occasionally with lying down and lasts a few minutes.Had normal mammogram at [facility] recently.  Notes no lumps or bumps. Has primary care and cardiology for f/u. Has strong f/h of CAD as well as breast Ca. Worried about hot flashes, night sweats few times/wk.  Menopausal at age 66. Reports vaginal dryness, is using lubrication. She is really struggling with loss of sexual desire and would like something for that. Also c/o leakage of urine.  Initially, seemed like stress induced, however, lately she is having more leakage not related to stress.  Doesn't sound like true urge either.  History   Social History  . Marital Status: Married    Spouse Name: N/A    Number of Children: N/A  . Years of Education: N/A   Occupational History  . Full Time    Social History Main Topics  . Smoking status: Never Smoker   . Smokeless tobacco: Never Used  . Alcohol Use: Yes     Comment: rare  . Drug Use: No  . Sexual Activity: Yes    Partners: Male    Birth Control/ Protection: Post-menopausal   Other Topics Concern  . Not on file   Social History Narrative   Regular exercise: No   Health Maintenance  Topic Date Due  . Tetanus/tdap  08/12/1971  . Mammogram  08/12/2002  . Colonoscopy  08/12/2002  . Zostavax  08/11/2012  . Influenza Vaccine  11/02/2013 (Originally 09/02/2013)  . Pap Smear  04/26/2015    The following portions of the patient's history were reviewed and updated as appropriate: allergies, current medications, past family history, past medical history, past social history, past surgical history and problem list.  Review of Systems Pertinent items are noted in HPI.   Objective:    BP 112/74  Pulse 90  Ht 5\' 2"  (1.575 m)  Wt 196 lb 9.6 oz (89.177 kg)  BMI 35.95 kg/m2 General appearance: alert,  cooperative and appears stated age Head: Normocephalic, without obvious abnormality, atraumatic Neck: no adenopathy, supple, symmetrical, trachea midline and thyroid not enlarged, symmetric, no tenderness/mass/nodules Lungs: clear to auscultation bilaterally Breasts: normal appearance, no masses or tenderness Heart: regular rate and rhythm, S1, S2 normal, no murmur, click, rub or gallop Abdomen: soft, non-tender; bowel sounds normal; no masses,  no organomegaly Pelvic: cervix normal in appearance, external genitalia normal, no adnexal masses or tenderness, no cervical motion tenderness, uterus normal size, shape, and consistency and atrophic vagina, good support Extremities: extremities normal, atraumatic, no cyanosis or edema Pulses: 2+ and symmetric Skin: Skin color, texture, turgor normal. No rashes or lesions Lymph nodes: Cervical, supraclavicular, and axillary nodes normal. Neurologic: Grossly normal    Assessment:    Menopausal female exam.      Plan:      Problem List Items Addressed This Visit     Unprioritized   Menopause     Advised of problems with starting HRT this far from menopause.  Advised current guidelines, recommend for only 5 years and close to menopause.  Risks of CAD, CVA and Breast ca discussed--similarly not a great candidate for testosterone for the same reasons. Discussed that evidence of improvement of sex drive is not good with either anyway.    Urinary incontinence     Given unclear nature of this, advised she keep  a diary.  Stress may be fixed with surgery, but urge would likely need medication.  Advised to decrease caffeine intake and noted on 2 diuretics, which probably is not helping.     Other Visit Diagnoses   Screening for malignant neoplasm of the cervix    -  Primary    Relevant Orders       Cytology - PAP    Routine gynecological examination           See After Visit Summary for Counseling Recommendations

## 2013-09-12 NOTE — Assessment & Plan Note (Signed)
Given unclear nature of this, advised she keep a diary.  Stress may be fixed with surgery, but urge would likely need medication.  Advised to decrease caffeine intake and noted on 2 diuretics, which probably is not helping.

## 2013-09-14 ENCOUNTER — Ambulatory Visit: Payer: 59 | Admitting: Obstetrics & Gynecology

## 2013-09-18 LAB — CYTOLOGY - PAP

## 2013-12-04 ENCOUNTER — Encounter: Payer: Self-pay | Admitting: Family Medicine

## 2014-05-09 ENCOUNTER — Ambulatory Visit: Payer: Self-pay | Admitting: Family Medicine

## 2014-05-09 DIAGNOSIS — N644 Mastodynia: Secondary | ICD-10-CM

## 2014-06-06 ENCOUNTER — Ambulatory Visit: Payer: Self-pay | Admitting: Family Medicine

## 2014-06-11 ENCOUNTER — Other Ambulatory Visit: Payer: Self-pay | Admitting: Family Medicine

## 2014-06-11 ENCOUNTER — Encounter: Payer: Self-pay | Admitting: Family Medicine

## 2014-06-11 ENCOUNTER — Ambulatory Visit (INDEPENDENT_AMBULATORY_CARE_PROVIDER_SITE_OTHER): Payer: 59 | Admitting: Family Medicine

## 2014-06-11 VITALS — BP 111/73 | HR 89 | Wt 209.0 lb

## 2014-06-11 DIAGNOSIS — R609 Edema, unspecified: Secondary | ICD-10-CM | POA: Insufficient documentation

## 2014-06-11 DIAGNOSIS — N644 Mastodynia: Secondary | ICD-10-CM

## 2014-06-11 DIAGNOSIS — E039 Hypothyroidism, unspecified: Secondary | ICD-10-CM | POA: Insufficient documentation

## 2014-06-11 DIAGNOSIS — G43909 Migraine, unspecified, not intractable, without status migrainosus: Secondary | ICD-10-CM | POA: Insufficient documentation

## 2014-06-11 DIAGNOSIS — M1990 Unspecified osteoarthritis, unspecified site: Secondary | ICD-10-CM | POA: Insufficient documentation

## 2014-06-11 DIAGNOSIS — G4730 Sleep apnea, unspecified: Secondary | ICD-10-CM | POA: Insufficient documentation

## 2014-06-11 DIAGNOSIS — E790 Hyperuricemia without signs of inflammatory arthritis and tophaceous disease: Secondary | ICD-10-CM | POA: Insufficient documentation

## 2014-06-11 NOTE — Patient Instructions (Signed)
Fibrocystic Breast Changes Fibrocystic breast changes occur when breast ducts become blocked, causing painful, fluid-filled lumps (cysts) to form in the breast. This is a common condition that is noncancerous (benign). It occurs when women go through hormonal changes during their menstrual cycle. Fibrocystic breast changes can affect one or both breasts. CAUSES  The exact cause of fibrocystic breast changes is not known, but it may be related to the female hormones estrogen and progesterone. Family traits that get passed from parent to child (genetics) may also be a factor in some cases. SIGNS AND SYMPTOMS   Tenderness, mild discomfort, or pain.   Swelling.   Ropelike feeling when touching the breast.   Lumpy breast, one or both sides.   Changes in breast size, especially before (larger) and after (smaller) the menstrual period.   Green or dark brown nipple discharge (not blood).  Symptoms are usually worse before menstrual periods start and get better toward the end of the menstrual period.  DIAGNOSIS  To make a diagnosis, your health care provider will ask you questions and perform a physical exam of your breasts. The health care provider may recommend other tests that can examine inside your breasts, such as:  A breast X-ray (mammogram).   Ultrasonography.  An MRI.  If something more than fibrocystic breast changes is suspected, your health care provider may take a breast tissue sample (breast biopsy) to examine. TREATMENT  Often, treatment is not needed. Your health care provider may recommend over-the-counter pain relievers to help lessen pain or discomfort caused by the fibrocystic breast changes. You may also be asked to change your diet to limit or stop eating foods or drinking beverages that contain caffeine. Foods and beverages that contain caffeine include chocolate, soda, coffee, and tea. Reducing sugar and fat in your diet may also help. Your health care provider may  also recommend:  Fine needle aspiration to remove fluid from a cyst that is causing pain.   Surgery to remove a large, persistent, and tender cyst. HOME CARE INSTRUCTIONS   Examine your breasts after every menstrual period. If you do not have menstrual periods, check your breasts the first day of every month. Feel for changes, such as more tenderness, a new growth, a change in breast size, or a change in a lump that has always been there.   Only take over-the-counter or prescription medicine as directed by your health care provider.   Wear a well-fitted support or sports bra, especially when exercising.   Decrease or avoid caffeine, fat, and sugar in your diet as directed by your health care provider.  SEEK MEDICAL CARE IF:   You have fluid leaking (discharge) from your nipples, especially bloody discharge.   You have new lumps or bumps in the breast.   Your breast or breasts become enlarged, red, and painful.   You have areas of your breast that pucker in.   Your nipples appear flat or indented.  Document Released: 11/05/2005 Document Revised: 01/24/2013 Document Reviewed: 07/10/2012 [facility] Patient Information 2015 [place], [place]. This information is not intended to replace advice given to you by your health care provider. Make sure you discuss any questions you have with your health care provider.

## 2014-06-11 NOTE — Assessment & Plan Note (Signed)
Given persistent soreness and f/h will schedule u/s.  Advised for Tomosynthesis at next screening mammogram.

## 2014-06-11 NOTE — Progress Notes (Signed)
    Subjective:    Patient ID: [name] is a 64 y.o. female presenting with Follow-up  on 06/11/2014  HPI: Right breast soreness x seven months. First reported this at her annual exam in 2015. Normal mammogram, but would like u/[name] as she has FH of breast cancer.  Her mother was diagnosed in her 19'[name]. Notes no lumps or bumps.  Has not found anything to alleviate the soreness. Pain is with pushing on breast or touching it. Denies rash.  Review of Systems  Constitutional: Negative for fever and chills.  Respiratory: Negative for shortness of breath.   Cardiovascular: Negative for chest pain.  Gastrointestinal: Negative for nausea, vomiting and abdominal pain.  Genitourinary: Negative for dysuria.  Skin: Negative for rash.      Objective:    BP 111/73 mmHg  Pulse 89  Wt 209 lb (94.802 kg) Physical Exam  Constitutional: She is oriented to person, place, and time. She appears well-developed and well-nourished. No distress.  HENT:  Head: Normocephalic and atraumatic.  Eyes: No scleral icterus.  Neck: Neck supple.  Cardiovascular: Normal rate.   Pulmonary/Chest: Effort normal.  Abdominal: Soft.  Neurological: She is alert and oriented to person, place, and time.  Skin: Skin is warm and dry.  Psychiatric: She has a normal mood and affect.        Assessment & Plan:   Problem List Items Addressed This Visit      Unprioritized   Soreness breast - Primary    Given persistent soreness and f/h will schedule u/[name].  Advised for Tomosynthesis at next screening mammogram.      Relevant Orders   US BREAST LTD UNI RIGHT INC AXILLA     Return in about 6 months (around 12/12/2014) for a CPE.    [name] 06/11/2014 4:41 PM

## 2014-06-15 ENCOUNTER — Ambulatory Visit
Admission: RE | Admit: 2014-06-15 | Discharge: 2014-06-15 | Disposition: A | Discharge status: Home / Self Care | Payer: 59 | Source: Ambulatory Visit | Attending: Family Medicine | Admitting: Family Medicine

## 2014-06-15 DIAGNOSIS — N644 Mastodynia: Secondary | ICD-10-CM

## 2015-08-14 ENCOUNTER — Other Ambulatory Visit: Payer: Self-pay | Admitting: Internal Medicine

## 2015-08-14 ENCOUNTER — Other Ambulatory Visit: Payer: Self-pay | Admitting: Family Medicine

## 2015-08-14 DIAGNOSIS — Z1231 Encounter for screening mammogram for malignant neoplasm of breast: Secondary | ICD-10-CM

## 2015-08-22 ENCOUNTER — Ambulatory Visit
Admission: RE | Admit: 2015-08-22 | Discharge: 2015-08-22 | Disposition: A | Discharge status: Home / Self Care | Payer: 59 | Source: Ambulatory Visit | Attending: Internal Medicine | Admitting: Internal Medicine

## 2015-08-22 DIAGNOSIS — Z1231 Encounter for screening mammogram for malignant neoplasm of breast: Secondary | ICD-10-CM

## 2015-09-01 ENCOUNTER — Emergency Department: Payer: 59

## 2015-09-01 ENCOUNTER — Emergency Department
Admission: EM | Admit: 2015-09-01 | Discharge: 2015-09-01 | Disposition: A | Discharge status: Home / Self Care | Payer: 59 | Attending: Emergency Medicine | Admitting: Emergency Medicine

## 2015-09-01 ENCOUNTER — Encounter: Payer: Self-pay | Admitting: Emergency Medicine

## 2015-09-01 DIAGNOSIS — W01198A Fall on same level from slipping, tripping and stumbling with subsequent striking against other object, initial encounter: Secondary | ICD-10-CM | POA: Diagnosis not present

## 2015-09-01 DIAGNOSIS — E039 Hypothyroidism, unspecified: Secondary | ICD-10-CM | POA: Diagnosis not present

## 2015-09-01 DIAGNOSIS — Z7982 Long term (current) use of aspirin: Secondary | ICD-10-CM | POA: Insufficient documentation

## 2015-09-01 DIAGNOSIS — S0003XA Contusion of scalp, initial encounter: Secondary | ICD-10-CM | POA: Insufficient documentation

## 2015-09-01 DIAGNOSIS — I10 Essential (primary) hypertension: Secondary | ICD-10-CM | POA: Insufficient documentation

## 2015-09-01 DIAGNOSIS — S8001XA Contusion of right knee, initial encounter: Secondary | ICD-10-CM | POA: Diagnosis not present

## 2015-09-01 DIAGNOSIS — Z79899 Other long term (current) drug therapy: Secondary | ICD-10-CM | POA: Insufficient documentation

## 2015-09-01 DIAGNOSIS — Z791 Long term (current) use of non-steroidal anti-inflammatories (NSAID): Secondary | ICD-10-CM | POA: Diagnosis not present

## 2015-09-01 DIAGNOSIS — S0990XA Unspecified injury of head, initial encounter: Secondary | ICD-10-CM | POA: Diagnosis not present

## 2015-09-01 DIAGNOSIS — Y999 Unspecified external cause status: Secondary | ICD-10-CM | POA: Insufficient documentation

## 2015-09-01 DIAGNOSIS — S8991XA Unspecified injury of right lower leg, initial encounter: Secondary | ICD-10-CM | POA: Diagnosis present

## 2015-09-01 DIAGNOSIS — Z8679 Personal history of other diseases of the circulatory system: Secondary | ICD-10-CM | POA: Insufficient documentation

## 2015-09-01 DIAGNOSIS — E785 Hyperlipidemia, unspecified: Secondary | ICD-10-CM | POA: Diagnosis not present

## 2015-09-01 DIAGNOSIS — Y939 Activity, unspecified: Secondary | ICD-10-CM | POA: Diagnosis not present

## 2015-09-01 DIAGNOSIS — Y92009 Unspecified place in unspecified non-institutional (private) residence as the place of occurrence of the external cause: Secondary | ICD-10-CM | POA: Insufficient documentation

## 2015-09-01 NOTE — ED Provider Notes (Signed)
[facility] Emergency Department Provider Note  ____________________________________________  Time seen: Approximately 3:25 PM  I have reviewed the triage vital signs and the nursing notes.   HISTORY  Chief Complaint Fall; Head Injury; and Knee Injury    HPI Kylie CLOER is a 65 y.o. female , NAD, presents to the emergency department accompanied by her husband who assists with history. Patient states she tripped over a box at home, falling forward hitting her head on her counter as well as hitting her right knee. Had no loss of consciousness, visual changes, dizziness, abdominal pain, nausea, vomiting, neck/back pain, numbness, weakness, tingling, saddle paresthesias, loss of bowel or bladder control, open wounds or lacerations due to the fall. Does note some swelling about her right knee but no open wounds or bruising. Denies use of any blood thinners other than she takes 2 tablets of 81 mg aspirin daily. Her husband at the bedside notes that the patient's demeanor has been normal and notes no changes in speech or gait other than limping due to the knee pain.   Past Medical History:  Diagnosis Date  . Anxiety   . Chest pain    Cardiac catheterization 6/10: Normal, EF 65%  . GERD (gastroesophageal reflux disease)   . History of migraines   . Hyperlipidemia   . Hypertension    mixed  . Mitral regurgitation    mild  . Obesity   . Obstructive sleep apnea   . Thyroid disease     Patient Active Problem List   Diagnosis Date Noted  . Dependent edema 06/11/2014  . Elevated blood uric acid level 06/11/2014  . Adult hypothyroidism 06/11/2014  . Apnea, sleep 06/11/2014  . Headache, migraine 06/11/2014  . Arthritis, degenerative 06/11/2014  . Arthritis 06/11/2014  . Soreness breast 06/11/2014  . Urinary incontinence 09/12/2013  . Menopause 03/04/2011  . PALPITATIONS 10/08/2009  . HYPERLIPIDEMIA-MIXED 07/12/2008  . OBSTRUCTIVE SLEEP APNEA 07/12/2008  .  MITRAL REGURGITATION, 0 (MILD) 07/12/2008  . HYPERTENSION, UNSPECIFIED 07/12/2008  . GERD 07/12/2008    History reviewed. No pertinent surgical history.  Prior to Admission medications   Medication Sig Start Date End Date Taking? Authorizing Provider  aspirin 81 MG tablet Take 162 mg by mouth daily.      Historical Provider, MD  atorvastatin (LIPITOR) 40 MG tablet Take 20 mg by mouth daily.     Historical Provider, MD  cholecalciferol (VITAMIN D) 1000 UNITS tablet Take 1,000 Units by mouth daily.    Historical Provider, MD  diazepam (VALIUM) 10 MG tablet Take 2.5 mg by mouth every 6 (six) hours as needed.      Historical Provider, MD  levothyroxine (SYNTHROID, LEVOTHROID) 50 MCG tablet  05/02/14   Historical Provider, MD  olmesartan-hydrochlorothiazide (BENICAR HCT) 20-12.5 MG per tablet Take 0.5 tablets by mouth daily.      Historical Provider, MD  potassium chloride (KLOR-CON) 10 MEQ CR tablet Take 10 mEq by mouth daily.      Historical Provider, MD  torsemide (DEMADEX) 10 MG tablet Take 10 mg by mouth daily.      Historical Provider, MD  traMADol Veatrice Bourbon) 50 MG tablet  06/08/14   Historical Provider, MD    Allergies Diclofenac; Codeine; Fluconazole in dextrose; and Sulfa antibiotics  Family History  Problem Relation Age of Onset  . Coronary artery disease      Strongly positive for premature CAD  . Cancer Mother 68    breast  . Arthritis Mother   .  Heart disease Mother   . Heart disease Father   . Heart disease Sister   . Cancer Sister 68    renal  . Heart disease Brother   . Cancer Maternal Grandmother 86    colorectal  . Heart disease Maternal Grandmother   . Heart disease Maternal Grandfather     Social History Social History  Substance Use Topics  . Smoking status: Never Smoker  . Smokeless tobacco: Never Used  . Alcohol use Yes     Comment: rare     Review of Systems  Constitutional: No fever/chills, Fatigue Eyes: No visual changes.  Cardiovascular: No  chest pain. Respiratory: No shortness of breath. No wheezing.  Gastrointestinal: No abdominal pain.  No nausea, vomiting.   Musculoskeletal:  Positive right knee pain. Negative for back pain.  Skin: Positive hematoma right scalp. Negative for rash, redness, swelling, open wounds or lacerations. Neurological: Negative for headaches, focal weakness or numbness. No LOC, dizziness, tingling, saddle paresthesias, loss of bowel or bladder control. 10-point ROS otherwise negative.  ____________________________________________   PHYSICAL EXAM:  VITAL SIGNS: ED Triage Vitals  Enc Vitals Group     BP 09/01/15 1439 (!) 154/77     Pulse Rate 09/01/15 1439 92     Resp 09/01/15 1439 18     Temp 09/01/15 1439 98.3 F (36.8 C)     Temp src --      SpO2 09/01/15 1439 100 %     Weight 09/01/15 1439 198 lb (89.8 kg)     Height 09/01/15 1439 5\' 2"  (1.575 m)     Head Circumference --      Peak Flow --      Pain Score 09/01/15 1458 2     Pain Loc --      Pain Edu? --      Excl. in [place]? --      Constitutional: Alert and oriented. Well appearing and in no acute distress. Eyes: Conjunctivae are normal without icterus or injection. PERRLA. EOMI without pain.  Head: Atraumatic. ENT:      Ears: No discharge noted from bilateral ear canals.      Nose: No congestion/rhinnorhea.      Mouth/Throat: Mucous membranes are moist.  Neck: Supple with full range of motion. No cervical spine tenderness to palpation. Hematological/Lymphatic/Immunilogical: No cervical lymphadenopathy. Cardiovascular: Normal rate, regular rhythm. Normal S1 and S2.  Good peripheral circulation with 2+ pulses noted in bilateral upper and lower extremities. Respiratory: Normal respiratory effort without tachypnea or retractions. Lungs CTAB with breath sounds noted in all lung fields. Musculoskeletal: Full range of motion of the right knee with minimal pain. No laxity with anterior or posterior drawer. No laxity with varus or valgus  stress. Negative patellofemoral grind. No lower extremity tenderness nor edema.  No joint effusions. Neurologic:  Normal speech and language. No gross focal neurologic deficits are appreciated. Cranial nerves III through XII grossly intact. Gait and posture are normal. Skin:  3 cm oblong hematoma noted to the right scalp without open wound or laceration. Mild tenderness to palpation but no fluctuance. Skin is warm, dry and intact. No rash noted. Psychiatric: Mood and affect are normal. Speech and behavior are normal. Patient exhibits appropriate insight and judgement.   ____________________________________________   LABS  None ____________________________________________  EKG  None ____________________________________________  RADIOLOGY I have personally viewed and evaluated these images (plain radiographs) as part of my medical decision making, as well as reviewing the written report by the radiologist.  Dg  Knee Complete 4 Views Right  Result Date: 09/01/2015 CLINICAL DATA:  Trip and fall with right knee pain, initial encounter EXAM: RIGHT KNEE - COMPLETE 4+ VIEW COMPARISON:  None. FINDINGS: No evidence of fracture, dislocation, or joint effusion. No evidence of arthropathy or other focal bone abnormality. Soft tissues are unremarkable. IMPRESSION: No acute abnormality noted. Electronically Signed   By: Inez Catalina M.D.   On: 09/01/2015 15:52   ____________________________________________    PROCEDURES  Procedure(s) performed: None   Procedures   Medications - No data to display   ____________________________________________   INITIAL IMPRESSION / ASSESSMENT AND PLAN / ED COURSE  Pertinent labs & imaging results that were available during my care of the patient were reviewed by me and considered in my medical decision making (see chart for details).  Clinical Course    Patient's diagnosis is consistent with Contusion of right knee, scalp contusion and head  injury. Discussion was had with the patient as well as her husband at the bedside in regards to options of head CT for head injury. Discussed that since the patient had no loss of consciousness, was not on any blood thinners nor had any neurologic symptoms since the incident that head CT at this time was not necessary. The patient and her husband at the bedside agreed with such and notes that if the patient has any new or worsening symptoms she will return to this emergency department for further evaluation and treatment to include head CT. Patient will be discharged home with instructions to take over-the-counter Tylenol or ibuprofen as needed for pain. May apply ice to the right knee as well as the scalp 20 minutes 3-4 times daily as needed. Patient is to follow up with her primary care provider in 2 days for recheck if symptoms persist past this treatment course. Patient is given ED precautions to return to the ED for any worsening or new symptoms.      ____________________________________________  FINAL CLINICAL IMPRESSION(S) / ED DIAGNOSES  Final diagnoses:  Contusion of right knee, initial encounter  Scalp contusion, initial encounter  Head injury, initial encounter      NEW MEDICATIONS STARTED DURING THIS VISIT:  Discharge Medication List as of 09/01/2015  4:03 PM           Braxton Feathers, PA-C 09/01/15 1637    Rudene Re, MD 09/01/15 1955

## 2015-09-01 NOTE — ED Triage Notes (Signed)
Pt presents to ED c/o fall when she tripped over a box and hit head on counter top and knee hit floor without LOC. Layed on the floor for about 30 minutes. Edema noted on right knee, bruising on right frontal side of head. Pain 2/10

## 2015-09-01 NOTE — ED Notes (Signed)
See triage note   States she tripped over a box  Hit head on counter  Large hematoma noted to right side of head  Also hit right knee  No deformity noted  Positive pulses

## 2016-10-09 ENCOUNTER — Other Ambulatory Visit: Payer: Self-pay | Admitting: Internal Medicine

## 2016-10-09 DIAGNOSIS — Z1231 Encounter for screening mammogram for malignant neoplasm of breast: Secondary | ICD-10-CM

## 2016-10-16 ENCOUNTER — Ambulatory Visit: Payer: 59

## 2016-11-04 ENCOUNTER — Ambulatory Visit: Payer: 59

## 2016-11-11 ENCOUNTER — Ambulatory Visit
Admission: RE | Admit: 2016-11-11 | Discharge: 2016-11-11 | Disposition: A | Discharge status: Home / Self Care | Payer: 59 | Source: Ambulatory Visit | Attending: Internal Medicine | Admitting: Internal Medicine

## 2016-11-11 DIAGNOSIS — Z1231 Encounter for screening mammogram for malignant neoplasm of breast: Secondary | ICD-10-CM

## 2017-08-23 NOTE — Progress Notes (Signed)
Last mammogram 11/02/2016- normal  Last pap 09/02/2013

## 2017-08-24 ENCOUNTER — Ambulatory Visit (INDEPENDENT_AMBULATORY_CARE_PROVIDER_SITE_OTHER): Payer: 59 | Admitting: Family Medicine

## 2017-08-24 ENCOUNTER — Encounter: Payer: Self-pay | Admitting: Family Medicine

## 2017-08-24 DIAGNOSIS — Z124 Encounter for screening for malignant neoplasm of cervix: Secondary | ICD-10-CM

## 2017-08-24 DIAGNOSIS — Z1151 Encounter for screening for human papillomavirus (HPV): Secondary | ICD-10-CM

## 2017-08-24 DIAGNOSIS — Z01419 Encounter for gynecological examination (general) (routine) without abnormal findings: Secondary | ICD-10-CM

## 2017-08-24 NOTE — Progress Notes (Signed)
  Subjective:     [name] is a 67 y.o. female and is here for a comprehensive physical exam. The patient reports no problems. Recently retired due to husband with early dementia and daughter going through a divorce. Tearful in office today.  Health Maintenance  Topic Date Due  . Hepatitis C Screening  09-12-1950  . HIV Screening  08/12/1967  . TETANUS/TDAP  08/12/1971  . COLONOSCOPY  08/12/2002  . PAP SMEAR  09/12/2016  . PNA vac Low Risk Adult (1 of 2 - PCV13) 08/11/2017  . INFLUENZA VACCINE  09/02/2017  . MAMMOGRAM  11/12/2018  . DEXA SCAN  Completed    The following portions of the patient's history were reviewed and updated as appropriate: allergies, current medications, past family history, past medical history, past social history, past surgical history and problem list.  Review of Systems Pertinent items are noted in HPI.   Objective:    BP 115/75 (BP Location: Left Arm, Patient Position: Sitting, Cuff Size: Large)   Pulse 91   Resp 18   Ht 5\' 2"  (1.575 m)   Wt 191 lb 3.2 oz (86.7 kg)   BMI 34.97 kg/m  General appearance: alert, cooperative, appears stated age and moderately obese Head: Normocephalic, without obvious abnormality, atraumatic Neck: no adenopathy, supple, symmetrical, trachea midline and thyroid not enlarged, symmetric, no tenderness/mass/nodules Lungs: clear to auscultation bilaterally Breasts: normal appearance, no masses or tenderness Heart: regular rate and rhythm, S1, S2 normal, no murmur, click, rub or gallop Abdomen: soft, non-tender; bowel sounds normal; no masses,  no organomegaly Pelvic: cervix normal in appearance, external genitalia normal, no adnexal masses or tenderness, no cervical motion tenderness, uterus normal size, shape, and consistency and vagina normal without discharge Extremities: extremities normal, atraumatic, no cyanosis or edema Pulses: 2+ and symmetric Skin: Skin color, texture, turgor normal. No rashes or  lesions Lymph nodes: Cervical, supraclavicular, and axillary nodes normal. Neurologic: Grossly normal    Assessment:    Healthy female exam.      Plan:      Problem List Items Addressed This Visit    None    Visit Diagnoses    Screening for malignant neoplasm of cervix       Should not need further pap smears.   Relevant Orders   Cytology - PAP   Encounter for gynecological examination without abnormal finding       Schedule colonoscopy, nml mammo 10/18   Relevant Orders   Cytology - PAP      See After Visit Summary for Counseling Recommendations

## 2017-08-24 NOTE — Patient Instructions (Signed)
Preventive Care 65 Years and Older, Female Preventive care refers to lifestyle choices and visits with your health care provider that can promote health and wellness. What does preventive care include?  A yearly physical exam. This is also called an annual well check.  Dental exams once or twice a year.  Routine eye exams. Ask your health care provider how often you should have your eyes checked.  Personal lifestyle choices, including: ? Daily care of your teeth and gums. ? Regular physical activity. ? Eating a healthy diet. ? Avoiding tobacco and drug use. ? Limiting alcohol use. ? Practicing safe sex. ? Taking low-dose aspirin every day. ? Taking vitamin and mineral supplements as recommended by your health care provider. What happens during an annual well check? The services and screenings done by your health care provider during your annual well check will depend on your age, overall health, lifestyle risk factors, and family history of disease. Counseling Your health care provider may ask you questions about your:  Alcohol use.  Tobacco use.  Drug use.  Emotional well-being.  Home and relationship well-being.  Sexual activity.  Eating habits.  History of falls.  Memory and ability to understand (cognition).  Work and work Statistician.  Reproductive health.  Screening You may have the following tests or measurements:  Height, weight, and BMI.  Blood pressure.  Lipid and cholesterol levels. These may be checked every 5 years, or more frequently if you are over 59 years old.  Skin check.  Lung cancer screening. You may have this screening every year starting at age 73 if you have a 30-pack-year history of smoking and currently smoke or have quit within the past 15 years.  Fecal occult blood test (FOBT) of the stool. You may have this test every year starting at age 43.  Flexible sigmoidoscopy or colonoscopy. You may have a sigmoidoscopy every 5 years or  a colonoscopy every 10 years starting at age 102.  Hepatitis C blood test.  Hepatitis B blood test.  Sexually transmitted disease (STD) testing.  Diabetes screening. This is done by checking your blood sugar (glucose) after you have not eaten for a while (fasting). You may have this done every 1-3 years.  Bone density scan. This is done to screen for osteoporosis. You may have this done starting at age 78.  Mammogram. This may be done every 1-2 years. Talk to your health care provider about how often you should have regular mammograms.  Talk with your health care provider about your test results, treatment options, and if necessary, the need for more tests. Vaccines Your health care provider may recommend certain vaccines, such as:  Influenza vaccine. This is recommended every year.  Tetanus, diphtheria, and acellular pertussis (Tdap, Td) vaccine. You may need a Td booster every 10 years.  Varicella vaccine. You may need this if you have not been vaccinated.  Zoster vaccine. You may need this after age 7.  Measles, mumps, and rubella (MMR) vaccine. You may need at least one dose of MMR if you were born in 1957 or later. You may also need a second dose.  Pneumococcal 13-valent conjugate (PCV13) vaccine. One dose is recommended after age 91.  Pneumococcal polysaccharide (PPSV23) vaccine. One dose is recommended after age 15.  Meningococcal vaccine. You may need this if you have certain conditions.  Hepatitis A vaccine. You may need this if you have certain conditions or if you travel or work in places where you may be exposed to hepatitis  A.  Hepatitis B vaccine. You may need this if you have certain conditions or if you travel or work in places where you may be exposed to hepatitis B.  Haemophilus influenzae type b (Hib) vaccine. You may need this if you have certain conditions.  Talk to your health care provider about which screenings and vaccines you need and how often you  need them. This information is not intended to replace advice given to you by your health care provider. Make sure you discuss any questions you have with your health care provider. Document Released: 02/15/2015 Document Revised: 10/09/2015 Document Reviewed: 11/20/2014 Elsevier Interactive Patient Education  [employer].

## 2017-08-26 LAB — CYTOLOGY - PAP
DIAGNOSIS: NEGATIVE
HPV: NOT DETECTED

## 2017-09-01 ENCOUNTER — Telehealth: Payer: Self-pay

## 2017-09-01 NOTE — Telephone Encounter (Signed)
New Message: ° ° ° ° ° ° °Pt is returning a call °

## 2017-09-01 NOTE — Telephone Encounter (Signed)
Dr. Burt Knack received referral for patient to be evaluated for CP.  Left message to call back to arrange appointment.

## 2017-09-02 NOTE — Telephone Encounter (Signed)
Patient has not been seen by Dr. Burt Knack since 2012.  Next available with Dr. Burt Knack is November. Called to arrange next available with Dr. Burt Knack or as a NP to establish with general cardiology sooner.  Left message to call back.

## 2017-09-03 ENCOUNTER — Telehealth: Payer: Self-pay

## 2017-09-03 NOTE — Telephone Encounter (Signed)
Sent referral to scheduling and filed notes 

## 2017-09-09 NOTE — Telephone Encounter (Signed)
° °  Patient called, she has declined to schedule referral.

## 2017-09-13 NOTE — Telephone Encounter (Signed)
Per August 8/2 phone encounter, the patient called 8/8 and declined to schedule appointment at all.

## 2017-09-14 ENCOUNTER — Telehealth: Payer: Self-pay

## 2017-09-14 NOTE — Telephone Encounter (Signed)
Called patient. She had general questions about mitral regurgitation. Discussed and answered her questions. She was grateful for call.

## 2017-09-14 NOTE — Telephone Encounter (Signed)
-----   Message from [place] sent at 09/13/2017 10:46 AM EDT ----- Cathe Mons,   I spoke with patient this am because Im forwarding her records to [facility]. However she had a question about Mitral Valve Regurgitation  noted in her chart and would like a call back to discuss it.   Thanks, Maudie Mercury

## 2018-08-17 ENCOUNTER — Encounter: Payer: Self-pay | Admitting: Radiology

## 2018-12-19 ENCOUNTER — Observation Stay
Admission: EM | Admit: 2018-12-19 | Discharge: 2018-12-20 | Disposition: A | Discharge status: Home / Self Care | Payer: Medicare Other | Attending: Internal Medicine | Admitting: Internal Medicine

## 2018-12-19 ENCOUNTER — Observation Stay: Payer: Medicare Other

## 2018-12-19 ENCOUNTER — Encounter: Payer: Self-pay | Admitting: Emergency Medicine

## 2018-12-19 ENCOUNTER — Inpatient Hospital Stay: Payer: Medicare Other

## 2018-12-19 ENCOUNTER — Other Ambulatory Visit: Payer: Self-pay

## 2018-12-19 ENCOUNTER — Emergency Department: Payer: Medicare Other

## 2018-12-19 DIAGNOSIS — R29898 Other symptoms and signs involving the musculoskeletal system: Secondary | ICD-10-CM | POA: Diagnosis present

## 2018-12-19 DIAGNOSIS — K219 Gastro-esophageal reflux disease without esophagitis: Secondary | ICD-10-CM | POA: Insufficient documentation

## 2018-12-19 DIAGNOSIS — I10 Essential (primary) hypertension: Secondary | ICD-10-CM | POA: Diagnosis present

## 2018-12-19 DIAGNOSIS — Z8249 Family history of ischemic heart disease and other diseases of the circulatory system: Secondary | ICD-10-CM | POA: Diagnosis not present

## 2018-12-19 DIAGNOSIS — E039 Hypothyroidism, unspecified: Secondary | ICD-10-CM | POA: Diagnosis present

## 2018-12-19 DIAGNOSIS — R42 Dizziness and giddiness: Secondary | ICD-10-CM | POA: Diagnosis present

## 2018-12-19 DIAGNOSIS — Z6836 Body mass index (BMI) 36.0-36.9, adult: Secondary | ICD-10-CM | POA: Insufficient documentation

## 2018-12-19 DIAGNOSIS — Z79890 Hormone replacement therapy (postmenopausal): Secondary | ICD-10-CM | POA: Insufficient documentation

## 2018-12-19 DIAGNOSIS — I700 Atherosclerosis of aorta: Secondary | ICD-10-CM | POA: Insufficient documentation

## 2018-12-19 DIAGNOSIS — E079 Disorder of thyroid, unspecified: Secondary | ICD-10-CM | POA: Insufficient documentation

## 2018-12-19 DIAGNOSIS — Z79899 Other long term (current) drug therapy: Secondary | ICD-10-CM | POA: Insufficient documentation

## 2018-12-19 DIAGNOSIS — Z882 Allergy status to sulfonamides status: Secondary | ICD-10-CM | POA: Diagnosis not present

## 2018-12-19 DIAGNOSIS — E785 Hyperlipidemia, unspecified: Secondary | ICD-10-CM | POA: Diagnosis not present

## 2018-12-19 DIAGNOSIS — Z883 Allergy status to other anti-infective agents status: Secondary | ICD-10-CM | POA: Diagnosis not present

## 2018-12-19 DIAGNOSIS — G4733 Obstructive sleep apnea (adult) (pediatric): Secondary | ICD-10-CM | POA: Diagnosis present

## 2018-12-19 DIAGNOSIS — Z23 Encounter for immunization: Secondary | ICD-10-CM | POA: Diagnosis not present

## 2018-12-19 DIAGNOSIS — Z7982 Long term (current) use of aspirin: Secondary | ICD-10-CM | POA: Insufficient documentation

## 2018-12-19 DIAGNOSIS — Z885 Allergy status to narcotic agent status: Secondary | ICD-10-CM | POA: Diagnosis not present

## 2018-12-19 DIAGNOSIS — Z20828 Contact with and (suspected) exposure to other viral communicable diseases: Secondary | ICD-10-CM | POA: Diagnosis not present

## 2018-12-19 LAB — BASIC METABOLIC PANEL
Anion gap: 12 (ref 5–15)
BUN: 12 mg/dL (ref 8–23)
CO2: 26 mmol/L (ref 22–32)
Calcium: 9.7 mg/dL (ref 8.9–10.3)
Chloride: 99 mmol/L (ref 98–111)
Creatinine, Ser: 1.09 mg/dL — ABNORMAL HIGH (ref 0.44–1.00)
GFR calc Af Amer: >60 mL/min (ref >60)
GFR calc non Af Amer: 53 mL/min — ABNORMAL LOW (ref >60)
Glucose, Bld: 134 mg/dL — ABNORMAL HIGH (ref 70–99)
Potassium: 4.2 mmol/L (ref 3.5–5.1)
Sodium: 137 mmol/L (ref 135–145)

## 2018-12-19 LAB — URINALYSIS, COMPLETE (UACMP) WITH MICROSCOPIC
Bacteria, UA: NONE SEEN
Bilirubin Urine: NEGATIVE
Glucose, UA: NEGATIVE mg/dL
Hgb urine dipstick: NEGATIVE
Ketones, ur: NEGATIVE mg/dL
Nitrite: NEGATIVE
Protein, ur: NEGATIVE mg/dL
Specific Gravity, Urine: 1.009 (ref 1.005–1.030)
pH: 8 (ref 5.0–8.0)

## 2018-12-19 LAB — CBC
HCT: 40 % (ref 36.0–46.0)
Hemoglobin: 13 g/dL (ref 12.0–15.0)
MCH: 28.3 pg (ref 26.0–34.0)
MCHC: 32.5 g/dL (ref 30.0–36.0)
MCV: 87 fL (ref 80.0–100.0)
Platelets: 283 10*3/uL (ref 150–400)
RBC: 4.6 MIL/uL (ref 3.87–5.11)
RDW: 12.2 % (ref 11.5–15.5)
WBC: 8.4 10*3/uL (ref 4.0–10.5)
nRBC: 0 % (ref 0.0–0.2)

## 2018-12-19 MED ORDER — ROSUVASTATIN CALCIUM 10 MG PO TABS
40.0000 mg | ORAL_TABLET | Freq: Every day | ORAL | Status: DC
  Administered 2018-12-20: 10:00:00 40 mg via ORAL
  Filled 2018-12-19: qty 4

## 2018-12-19 MED ORDER — HEPARIN SODIUM (PORCINE) 5000 UNIT/ML IJ SOLN
5000.0000 [IU] | Freq: Three times a day (TID) | INTRAMUSCULAR | Status: DC
  Administered 2018-12-20: 01:00:00 5000 [IU] via SUBCUTANEOUS
  Filled 2018-12-19: qty 1

## 2018-12-19 MED ORDER — ASPIRIN EC 81 MG PO TBEC
162.0000 mg | DELAYED_RELEASE_TABLET | Freq: Every day | ORAL | Status: DC
  Administered 2018-12-20: 162 mg via ORAL
  Filled 2018-12-19: qty 2

## 2018-12-19 MED ORDER — SODIUM CHLORIDE 0.9 % IV SOLN
INTRAVENOUS | Status: DC
  Administered 2018-12-20: 01:00:00 via INTRAVENOUS

## 2018-12-19 MED ORDER — DIAZEPAM 5 MG/ML IJ SOLN: 2.5000 mg | Freq: Once | INTRAMUSCULAR | Status: DC

## 2018-12-19 MED ORDER — TEMAZEPAM 15 MG PO CAPS: 15.0000 mg | ORAL_CAPSULE | Freq: Every evening | ORAL | Status: DC | PRN

## 2018-12-19 MED ORDER — MECLIZINE HCL 25 MG PO TABS
50.0000 mg | ORAL_TABLET | Freq: Once | ORAL | Status: AC
  Administered 2018-12-19: 50 mg via ORAL
  Filled 2018-12-19: qty 2

## 2018-12-19 MED ORDER — STROKE: EARLY STAGES OF RECOVERY BOOK
Freq: Once | Status: AC
  Administered 2018-12-19

## 2018-12-19 MED ORDER — DIAZEPAM 5 MG PO TABS
2.5000 mg | ORAL_TABLET | Freq: Four times a day (QID) | ORAL | Status: DC | PRN
  Administered 2018-12-19: 2.5 mg via ORAL
  Filled 2018-12-19: qty 1

## 2018-12-19 MED ORDER — LEVOTHYROXINE SODIUM 100 MCG PO TABS
100.0000 ug | ORAL_TABLET | Freq: Every day | ORAL | Status: DC
  Administered 2018-12-20: 100 ug via ORAL
  Filled 2018-12-19: qty 1

## 2018-12-19 MED ORDER — ACETAMINOPHEN 650 MG RE SUPP: 650.0000 mg | RECTAL | Status: DC | PRN

## 2018-12-19 MED ORDER — ACETAMINOPHEN 160 MG/5ML PO SOLN
650.0000 mg | ORAL | Status: DC | PRN
  Filled 2018-12-19: qty 20.3

## 2018-12-19 MED ORDER — ACETAMINOPHEN 325 MG PO TABS
650.0000 mg | ORAL_TABLET | ORAL | Status: DC | PRN
  Administered 2018-12-20: 650 mg via ORAL
  Filled 2018-12-19: qty 2

## 2018-12-19 MED ORDER — VITAMIN D3 25 MCG (1000 UNIT) PO TABS
1000.0000 [IU] | ORAL_TABLET | Freq: Every day | ORAL | Status: DC
  Administered 2018-12-20: 1000 [IU] via ORAL
  Filled 2018-12-19 (×2): qty 1

## 2018-12-19 NOTE — ED Provider Notes (Signed)
[facility] Emergency Department Provider Note       Time seen: ----------------------------------------- 1:50 PM on 12/19/2018 -----------------------------------------   I have reviewed the triage vital signs and the nursing notes.  HISTORY   Chief Complaint Dizziness    HPI [name] is a 68 y.o. female with a history of anxiety, chest pain, GERD, hyperlipidemia, hypertension who presents to the ED for dizziness today.  This is described as room spinning.  This is worse when she gets up from bed or laying down.  She has had some nausea, also reports some left leg weakness.  Patient states that she will sometimes has to pick up the left leg to walk, especially upstairs.  Leg weakness has been going on for 3 days.  Past Medical History:  Diagnosis Date  . Anxiety   . Chest pain    Cardiac catheterization 6/10: Normal, EF 65%  . GERD (gastroesophageal reflux disease)   . History of migraines   . Hx of dysplastic nevus    multiple sites  . Hyperlipidemia   . Hypertension    mixed  . Mitral regurgitation    mild  . Obesity   . Obstructive sleep apnea   . Thyroid disease     Patient Active Problem List   Diagnosis Date Noted  . Dependent edema 06/11/2014  . Elevated blood uric acid level 06/11/2014  . Adult hypothyroidism 06/11/2014  . Apnea, sleep 06/11/2014  . Headache, migraine 06/11/2014  . Arthritis, degenerative 06/11/2014  . Arthritis 06/11/2014  . Soreness breast 06/11/2014  . Urinary incontinence 09/12/2013  . Menopause 03/04/2011  . PALPITATIONS 10/08/2009  . HYPERLIPIDEMIA-MIXED 07/12/2008  . OBSTRUCTIVE SLEEP APNEA 07/12/2008  . MITRAL REGURGITATION, 0 (MILD) 07/12/2008  . HYPERTENSION, UNSPECIFIED 07/12/2008  . GERD 07/12/2008    History reviewed. No pertinent surgical history.  Allergies Diclofenac, Codeine, Fluconazole in dextrose, Sulfa antibiotics, and Paroxetine hcl  Social History Social History    Tobacco Use  . Smoking status: Never Smoker  . Smokeless tobacco: Never Used  Substance Use Topics  . Alcohol use: Yes    Comment: rare  . Drug use: No   Review of Systems Constitutional: Negative for fever. Cardiovascular: Negative for chest pain. Respiratory: Negative for shortness of breath. Gastrointestinal: Negative for abdominal pain, vomiting and diarrhea. Musculoskeletal: Positive for back pain Skin: Negative for rash. Neurological: Positive for left leg weakness, positive for dizziness  All systems negative/normal/unremarkable except as stated in the HPI  ____________________________________________   PHYSICAL EXAM:  VITAL SIGNS: ED Triage Vitals  Enc Vitals Group     BP 12/19/18 1222 (!) 148/81     Pulse Rate 12/19/18 1222 88     Resp 12/19/18 1222 18     Temp 12/19/18 1222 98.2 F (36.8 C)     Temp Source 12/19/18 1222 Oral     SpO2 12/19/18 1222 99 %     Weight 12/19/18 1222 190 lb (86.2 kg)     Height 12/19/18 1222 5\' 2"  (1.575 m)     Head Circumference --      Peak Flow --      Pain Score 12/19/18 1225 0     Pain Loc --      Pain Edu? --      Excl. in [place]? --    Constitutional: Alert and oriented. Well appearing and in no distress. Eyes: Conjunctivae are normal. Normal extraocular movements. ENT      Head: Normocephalic and atraumatic.  Nose: No congestion/rhinnorhea.      Mouth/Throat: Mucous membranes are moist.      Neck: No stridor. Cardiovascular: Normal rate, regular rhythm. No murmurs, rubs, or gallops. Respiratory: Normal respiratory effort without tachypnea nor retractions. Breath sounds are clear and equal bilaterally. No wheezes/rales/rhonchi. Gastrointestinal: Soft and nontender. Normal bowel sounds Musculoskeletal: Nontender with normal range of motion in extremities. No lower extremity tenderness nor edema. Neurologic:  Normal speech and language.  Left leg weakness compared to right, no other sensory or motor deficit is  appreciated. Skin:  Skin is warm, dry and intact. No rash noted. Psychiatric: Mood and affect are normal. Speech and behavior are normal.  ____________________________________________  EKG: Interpreted by me.  Sinus rhythm with rate of 88 bpm, normal PR interval, normal QRS, normal QT  ____________________________________________  ED COURSE:  As part of my medical decision making, I reviewed the following data within the [place] History obtained from family if available, nursing notes, old chart and ekg, as well as notes from prior ED visits. Patient presented for dizziness, we will assess with labs and imaging as indicated at this time.   Procedures  MARQUITE TORMA was evaluated in Emergency Department on 12/19/2018 for the symptoms described in the history of present illness. She was evaluated in the context of the global COVID-19 pandemic, which necessitated consideration that the patient might be at risk for infection with the SARS-CoV-2 virus that causes COVID-19. Institutional protocols and algorithms that pertain to the evaluation of patients at risk for COVID-19 are in a state of rapid change based on information released by regulatory bodies including the CDC and federal and state organizations. These policies and algorithms were followed during the patient's care in the ED.  ____________________________________________   LABS (pertinent positives/negatives)  Labs Reviewed  BASIC METABOLIC PANEL - Abnormal; Notable for the following components:      Result Value   Glucose, Bld 134 (*)    Creatinine, Ser 1.09 (*)    GFR calc non Af Amer 53 (*)    All other components within normal limits  URINALYSIS, COMPLETE (UACMP) WITH MICROSCOPIC - Abnormal; Notable for the following components:   Color, Urine YELLOW (*)    APPearance HAZY (*)    Leukocytes,Ua SMALL (*)    All other components within normal limits  CBC   CT head IMPRESSION:  Negative head CT for  age. No abnormality seen to explain the  clinical presentation.  ____________________________________________   DIFFERENTIAL DIAGNOSIS   Vertigo, dehydration, electrolyte abnormality, anemia, orthostasis  FINAL ASSESSMENT AND PLAN  Vertigo, left leg weakness   Plan: The patient had presented for dizziness which resembles peripheral vertigo. Patient's labs are unremarkable.  CT head was unremarkable, I do feel like she needs an MRI of her brain and possibly her lumbar spine.  Symptoms suggest peripheral vertigo and possibly sciatica although she could have a stroke as well.   Laurence Aly, MD    Note: This note was generated in part or whole with voice recognition software. Voice recognition is usually quite accurate but there are transcription errors that can and very often do occur. I apologize for any typographical errors that were not detected and corrected.     Earleen Newport, MD 12/19/18 1504

## 2018-12-19 NOTE — ED Notes (Signed)
ED TO INPATIENT HANDOFF REPORT  ED Nurse Name and Phone #: Daiva Nakayama, [place]  S Name/Age/Gender [name] 68 y.o. female Room/Bed: ED17A/ED17A  Code Status   Code Status: Full Code  Home/SNF/Other Home Patient oriented to: self, place, time and situation Is this baseline? Yes   Triage Complete: Triage complete  Chief Complaint Dizzy via EMS   Triage Note Pt in [facility] from home with c/o N and dizziness and uper left back pain for 3 days. 134/76, HR91. HX of HTN, high cholesterol and hypothyroidism.   Pt has had dizziness for 3 days. Described as room spinning. Worse when getting up from bed this morning and laying down.  Has had some nausea.  Slid off bed this morning from it.  No fevers.  VSS. Pt alert and oriented.  Speech WNL   Allergies Allergies  Allergen Reactions  . Diclofenac Anaphylaxis, Other (See Comments) and Shortness Of Breath    Hypotension, face edema.  . Codeine   . Fluconazole In Dextrose     dizzy  . Sulfa Antibiotics Itching  . Paroxetine Hcl Anxiety    Level of Care/Admitting Diagnosis ED Disposition    ED Disposition Condition [facility]: [place] [100120]  Level of Care: Med-Surg [16]  Covid Evaluation: Asymptomatic Screening Protocol (No Symptoms)  Diagnosis: Dizziness [id number]  Admitting Physician: Cherylann Ratel  Attending Physician: Cherylann Ratel  PT Class (Do Not Modify): Observation [104]  PT Acc Code (Do Not Modify): Observation [10022]       B Medical/Surgery History Past Medical History:  Diagnosis Date  . Anxiety   . Chest pain    Cardiac catheterization 6/10: Normal, EF 65%  . GERD (gastroesophageal reflux disease)   . History of migraines   . Hx of dysplastic nevus    multiple sites  . Hyperlipidemia   . Hypertension    mixed  . Mitral regurgitation    mild  . Obesity   . Obstructive sleep apnea   . Thyroid disease    History reviewed. No  pertinent surgical history.   A IV Location/Drains/Wounds Patient Lines/Drains/Airways Status   Active Line/Drains/Airways    Name:   Placement date:   Placement time:   Site:   Days:   Peripheral IV 12/19/18 Right Antecubital   12/19/18    1651    Antecubital   less than 1          Intake/Output Last 24 hours No intake or output data in the 24 hours ending 12/19/18 2230  Labs/Imaging Results for orders placed or performed during the hospital encounter of 12/19/18 (from the past 48 hour(s))  Basic metabolic panel     Status: Abnormal   Collection Time: 12/19/18 12:27 PM  Result Value Ref Range   Sodium 137 135 - 145 mmol/L   Potassium 4.2 3.5 - 5.1 mmol/L   Chloride 99 98 - 111 mmol/L   CO2 26 22 - 32 mmol/L   Glucose, Bld 134 (H) 70 - 99 mg/dL   BUN 12 8 - 23 mg/dL   Creatinine, Ser 1.09 (H) 0.44 - 1.00 mg/dL   Calcium 9.7 8.9 - 10.3 mg/dL   GFR calc non Af Amer 53 (L) >60 mL/min   GFR calc Af Amer >60 >60 mL/min   Anion gap 12 5 - 15    Comment: Performed at [facility], [address], [place], [place] [address]  CBC  Status: None   Collection Time: 12/19/18 12:27 PM  Result Value Ref Range   WBC 8.4 4.0 - 10.5 K/uL   RBC 4.60 3.87 - 5.11 MIL/uL   Hemoglobin 13.0 12.0 - 15.0 g/dL   HCT 40.0 36.0 - 46.0 %   MCV 87.0 80.0 - 100.0 fL   MCH 28.3 26.0 - 34.0 pg   MCHC 32.5 30.0 - 36.0 g/dL   RDW 12.2 11.5 - 15.5 %   Platelets 283 150 - 400 K/uL   nRBC 0.0 0.0 - 0.2 %    Comment: Performed at [facility], [address] [place], [place] [address]  Urinalysis, Complete w Microscopic     Status: Abnormal   Collection Time: 12/19/18 12:27 PM  Result Value Ref Range   Color, Urine YELLOW (A) YELLOW   APPearance HAZY (A) CLEAR   Specific Gravity, Urine 1.009 1.005 - 1.030   pH 8.0 5.0 - 8.0   Glucose, UA NEGATIVE NEGATIVE mg/dL   Hgb urine dipstick NEGATIVE NEGATIVE   Bilirubin Urine NEGATIVE NEGATIVE   Ketones, ur NEGATIVE NEGATIVE mg/dL    Protein, ur NEGATIVE NEGATIVE mg/dL   Nitrite NEGATIVE NEGATIVE   Leukocytes,Ua SMALL (A) NEGATIVE   RBC / HPF 0-5 0 - 5 RBC/hpf   WBC, UA 6-10 0 - 5 WBC/hpf   Bacteria, UA NONE SEEN NONE SEEN   Squamous Epithelial / LPF 6-10 0 - 5    Comment: Performed at [facility], [address] [place], [place] [address]   Dg Chest 2 View  Result Date: 12/19/2018 CLINICAL DATA:  Dizziness for 3 days, nausea, history hypertension, GERD EXAM: CHEST - 2 VIEW COMPARISON:  07/04/2018 1,010 FINDINGS: Normal heart size, mediastinal contours, and pulmonary vascularity. Atherosclerotic calcification aorta. Lungs clear. No infiltrate, pleural effusion or pneumothorax. Bones demineralized. IMPRESSION: No acute abnormalities. Aortic atherosclerosis. Electronically Signed   By: Lavonia Dana M.D.   On: 12/19/2018 16:32   Ct Head Wo Contrast  Result Date: 12/19/2018 CLINICAL DATA:  Vertigo. Left leg weakness. Dizziness and headache. EXAM: CT HEAD WITHOUT CONTRAST TECHNIQUE: Contiguous axial images were obtained from the base of the skull through the vertex without intravenous contrast. COMPARISON:  08/12/2006 FINDINGS: Brain: The brain shows a normal appearance without evidence of malformation, atrophy, old or acute small or large vessel infarction, mass lesion, hemorrhage, hydrocephalus or extra-axial collection. Vascular: There is atherosclerotic calcification of the major vessels at the base of the brain. Skull: Normal.  No traumatic finding.  No focal bone lesion. Sinuses/Orbits: Sinuses are clear. Orbits appear normal. Mastoids are clear. Other: None significant IMPRESSION: Negative head CT for age. No abnormality seen to explain the clinical presentation. Electronically Signed   By: Nelson Chimes M.D.   On: 12/19/2018 14:47   Mr Brain Wo Contrast  Result Date: 12/19/2018 CLINICAL DATA:  Acute presentation today with dizziness. Left leg weakness. EXAM: MRI HEAD WITHOUT CONTRAST TECHNIQUE: Multiplanar,  multiecho pulse sequences of the brain and surrounding structures were obtained without intravenous contrast. COMPARISON:  Head CT same day FINDINGS: Brain: Diffusion imaging does not show any acute or subacute infarction. The brainstem and cerebellum are normal. Cerebral hemispheres are normal except for a very few punctate foci of T2 and FLAIR signal in the frontal white matter not likely to be significant. No large vessel territory infarction. No mass lesion, hemorrhage, hydrocephalus or extra-axial collection. Vascular: Major vessels at the base of the brain show flow. Skull and upper cervical spine: Negative Sinuses/Orbits: Clear/normal Other:  None IMPRESSION: No acute finding. Few punctate frontal white matter foci not likely of clinical significance. Otherwise normal exam. Electronically Signed   By: Nelson Chimes M.D.   On: 12/19/2018 17:54   Mr Lumbar Spine Wo Contrast  Result Date: 12/19/2018 CLINICAL DATA:  Acute presentation with hypertension, dizziness and left leg weakness. EXAM: MRI LUMBAR SPINE WITHOUT CONTRAST TECHNIQUE: Multiplanar, multisequence MR imaging of the lumbar spine was performed. No intravenous contrast was administered. COMPARISON:  None. FINDINGS: Segmentation:  5 lumbar type vertebral bodies. Alignment:  Normal Vertebrae: Normal. Insignificant hemangiomas in the right side of L1, L4 and S1 Conus medullaris and cauda equina: Conus extends to the T12-L1 level. Conus and cauda equina appear normal. Paraspinal and other soft tissues: Negative Disc levels: No abnormality at T11-12, T12-L1 or L1-2. L2-3: Mild desiccation and minimal bulging of the disc. No stenosis. L3-4: Desiccation and mild bulging of the disc. No compressive stenosis. L4-5: Normal interspace. L5-S1: Normal interspace. IMPRESSION: No cause of acute left leg symptoms is identified. Non-compressive disc bulges, larger at L3-4 than at L2-3. Electronically Signed   By: Nelson Chimes M.D.   On: 12/19/2018 17:57     Pending Labs Unresulted Labs (From admission, onward)    Start     Ordered   12/20/18 0500  Hemoglobin A1c  Tomorrow morning,   STAT     12/19/18 1602   12/20/18 0500  Lipid panel  Tomorrow morning,   STAT    Comments: Fasting    12/19/18 1602   12/19/18 1704  SARS CORONAVIRUS 2 (TAT 6-24 HRS) Nasopharyngeal Nasopharyngeal Swab  (Asymptomatic/Tier 2 Patients Labs)  Once,   STAT    Question Answer Comment  Is this test for diagnosis or screening Screening   Symptomatic for COVID-19 as defined by CDC No   Hospitalized for COVID-19 No   Admitted to ICU for COVID-19 No   Previously tested for COVID-19 No   Resident in a congregate (group) care setting No   Employed in healthcare setting No   Pregnant No      12/19/18 1703   12/19/18 1603  HIV Antibody (routine testing w rflx)  (HIV Antibody (Routine testing w reflex) panel)  Once,   STAT     12/19/18 1602   Signed and Held  Creatinine, serum  (heparin)  Once,   R    Comments: Baseline for heparin therapy IF NOT ALREADY DRAWN.    Signed and Held          Vitals/Pain Today's Vitals   12/19/18 1530 12/19/18 1600 12/19/18 1831 12/19/18 1946  BP: (!) 163/66 (!) 148/91 (!) 151/81 (!) 153/76  Pulse: 95 86 86 92  Resp: 20 15  17   Temp:      TempSrc:      SpO2: 99% 100% 99% 99%  Weight:      Height:      PainSc:   0-No pain     Isolation Precautions No active isolations  Medications Medications  aspirin EC tablet 162 mg (has no administration in time range)  cholecalciferol (VITAMIN D) tablet 1,000 Units (has no administration in time range)  levothyroxine (SYNTHROID) tablet 100 mcg (has no administration in time range)  rosuvastatin (CRESTOR) tablet 40 mg (has no administration in time range)  diazepam (VALIUM) tablet 2.5 mg (2.5 mg Oral Given 12/19/18 1659)  temazepam (RESTORIL) capsule 15 mg (has no administration in time range)   stroke: mapping our early stages of recovery book (has no administration in  time  range)  acetaminophen (TYLENOL) tablet 650 mg (has no administration in time range)    Or  acetaminophen (TYLENOL) 160 MG/5ML solution 650 mg (has no administration in time range)    Or  acetaminophen (TYLENOL) suppository 650 mg (has no administration in time range)  diazepam (VALIUM) injection 2.5 mg (2.5 mg Intravenous Not Given 12/19/18 1738)  meclizine (ANTIVERT) tablet 50 mg (50 mg Oral Given 12/19/18 1418)    Mobility walks Low fall risk   Focused Assessments Neuro Assessment Handoff:  Swallow screen pass? Yes  Cardiac Rhythm: Normal sinus rhythm(HR 91) NIH Stroke Scale ( + Modified Stroke Scale Criteria)  Interval: Initial Level of Consciousness (1a.)   : Alert, keenly responsive LOC Questions (1b. )   +: Answers both questions correctly LOC Commands (1c. )   + : Performs both tasks correctly Best Gaze (2. )  +: Normal Visual (3. )  +: No visual loss Facial Palsy (4. )    : Normal symmetrical movements Motor Arm, Left (5a. )   +: No drift Motor Arm, Right (5b. )   +: No drift Motor Leg, Left (6a. )   +: No drift Motor Leg, Right (6b. )   +: No drift Limb Ataxia (7. ): Absent Sensory (8. )   +: Normal, no sensory loss Best Language (9. )   +: No aphasia Dysarthria (10. ): Normal Extinction/Inattention (11.)   +: No Abnormality Modified SS Total  +: 0 Complete NIHSS TOTAL: 0     Neuro Assessment: Exceptions to WDL Neuro Checks:   Initial (12/19/18 1704)  Last Documented NIHSS Modified Score: 0 (12/19/18 1704) Has TPA been given? No If patient is a Neuro Trauma and patient is going to OR before floor call report to [place] nurse: [phone] or [phone]     R Recommendations: See Admitting Provider Note  Report given to:   Additional Notes: None

## 2018-12-19 NOTE — ED Notes (Signed)
Pt in ct 

## 2018-12-19 NOTE — ED Triage Notes (Signed)
Pt in [facility] EMS from home with c/o N and dizziness and uper left back pain for 3 days. 134/76, HR91. HX of HTN, high cholesterol and hypothyroidism.

## 2018-12-19 NOTE — ED Triage Notes (Signed)
Pt has had dizziness for 3 days. Described as room spinning. Worse when getting up from bed this morning and laying down.  Has had some nausea.  Slid off bed this morning from it.  No fevers.  VSS. Pt alert and oriented.  Speech WNL

## 2018-12-19 NOTE — ED Notes (Signed)
Pt back from mri. No dizziness.

## 2018-12-19 NOTE — ED Notes (Signed)
Pt in mri 

## 2018-12-19 NOTE — H&P (Signed)
History and Physical    [name] [id number] DOB: 03/06/1950 DOA: 12/19/2018  PCP: Rusty Aus, MD (Confirm with patient/family/NH records and if not entered, this has to be entered at [facility] point of entry) Patient coming from: home  I have personally briefly reviewed patient's old medical records in [place]  Chief Complaint: Dizzy x 3 days  HPI: [name] is a 68 y.o. female with medical history significant of   Dizziness  X 3 days and has been getting worse, sitting up made her slid off the bed and could not get up. Pt had room spinning sensation. Nauseated/ pt reports diaphoresis, and left shoulder and back pain. Pt also has reports weakness in her legs that started 3 weeks ago , pt has [email or web address] who is pcp. Leg weakness x 3 weeks,she was having leg pain while going up steps, and has never  Had before. No slurred speech and ros is o/w negative. Pt has been taking torsemide for about 15 years.  Pt does have heart doctor in [place] and for establish care.   ED Course: Patient is alert awake and oriented gives her own history. Initial labs shows glucose of 134, serum creatinine of 1.09.  Initial head CT negative for any acute abnormalities. Although patient's initial presentation sounds like vertigo due to labyrinthitis will admit patient for TIA stroke evaluation.  Review of Systems: As per HPI otherwise 10 point review of systems negative.   Past Medical History:  Diagnosis Date   Anxiety    Chest pain    Cardiac catheterization 6/10: Normal, EF 65%   GERD (gastroesophageal reflux disease)    History of migraines    Hx of dysplastic nevus    multiple sites   Hyperlipidemia    Hypertension    mixed   Mitral regurgitation    mild   Obesity    Obstructive sleep apnea    Thyroid disease     History reviewed. No pertinent surgical history.   reports that she has never smoked. She has never used smokeless tobacco. She reports that she does  not drink alcohol or use drugs.  Allergies  Allergen Reactions   Diclofenac Anaphylaxis, Other (See Comments) and Shortness Of Breath    Hypotension, face edema.   Codeine    Fluconazole In Dextrose     dizzy   Sulfa Antibiotics Itching   Paroxetine Hcl Anxiety    Family History  Problem Relation Age of Onset   Cancer Mother 46       breast   Arthritis Mother    Heart disease Mother    Heart disease Father    Heart disease Sister    Cancer Sister 75       renal   Heart disease Brother    Cancer Maternal Grandmother 86       colorectal   Heart disease Maternal Grandmother    Heart disease Maternal Grandfather    Coronary artery disease Other        Strongly positive for premature CAD   Breast cancer Neg Hx      Prior to Admission medications   Medication Sig Start Date End Date Taking? Authorizing Provider  aspirin 81 MG tablet Take 162 mg by mouth daily.     Yes [provider]  BIOTIN PO Take 100 mg by mouth.   Yes [provider]  cholecalciferol (VITAMIN D) 1000 UNITS tablet Take 1,000 Units by mouth daily.   Yes [provider]  COLCRYS 0.6 MG tablet Take 1 tablet by mouth as needed. 07/29/17  Yes [provider]  diazepam (VALIUM) 10 MG tablet Take 2.5 mg by mouth every 6 (six) hours as needed.     Yes [provider]  levothyroxine (SYNTHROID, LEVOTHROID) 100 MCG tablet Take 1 tablet by mouth daily. 08/04/17  Yes [provider]  olmesartan (BENICAR) 20 MG tablet Take 20 mg by mouth daily. 12/06/18  Yes [provider]  potassium chloride (KLOR-CON) 10 MEQ CR tablet Take 10 mEq by mouth daily.     Yes [provider]  rosuvastatin (CRESTOR) 40 MG tablet Take 40 mg by mouth daily. 12/09/18  Yes [provider]  temazepam (RESTORIL) 15 MG capsule Take 15 mg by mouth as needed.  04/01/17  Yes [provider]  torsemide (DEMADEX) 10 MG tablet Take 10 mg by mouth daily.      Yes [provider]  traMADol (ULTRAM) 50 MG tablet Take 50 mg by mouth daily as needed.  06/08/14  Yes [provider]    Physical Exam: Vitals:   12/19/18 1222 12/19/18 1530 12/19/18 1600 12/19/18 1831  BP: (!) 148/81 (!) 163/66 (!) 148/91 (!) 151/81  Pulse: 88 95 86 86  Resp: 18 20 15    Temp: 98.2 F (36.8 C)     TempSrc: Oral     SpO2: 99% 99% 100% 99%  Weight: 86.2 kg     Height: 5\' 2"  (1.575 m)       Constitutional: NAD, calm, comfortable Vitals:   12/19/18 1222 12/19/18 1530 12/19/18 1600 12/19/18 1831  BP: (!) 148/81 (!) 163/66 (!) 148/91 (!) 151/81  Pulse: 88 95 86 86  Resp: 18 20 15    Temp: 98.2 F (36.8 C)     TempSrc: Oral     SpO2: 99% 99% 100% 99%  Weight: 86.2 kg     Height: 5\' 2"  (1.575 m)      Eyes: PERRL, lids and conjunctivae normal ENMT: Mucous membranes are moist. Posterior pharynx clear of any exudate or lesions.Normal dentition.  Neck: normal, supple, no masses, no thyromegaly Respiratory: clear to auscultation bilaterally, no wheezing, no crackles. Normal respiratory effort. No accessory muscle use.  Cardiovascular: Regular rate and rhythm, no murmurs / rubs / gallops. No extremity edema. 2+ pedal pulses. No carotid bruits.  Abdomen: no tenderness, no masses palpated. No hepatosplenomegaly. Bowel sounds positive.  Musculoskeletal: no clubbing / cyanosis. No joint deformity upper and lower extremities. Good ROM, no contractures. Normal muscle tone.  Skin: no rashes, lesions, ulcers. No induration Neurologic: CN 2-12 grossly intact. Sensation intact, DTR normal. Strength 5/5 in all 4.  Psychiatric: Normal judgment and insight. Alert and oriented x 3. Normal mood.    Labs on Admission: I have personally reviewed following labs and imaging studies  CBC: Recent Labs  Lab 12/19/18 1227  WBC 8.4  HGB 13.0  HCT 40.0  MCV 87.0  PLT [id number]   Basic Metabolic Panel: Recent Labs  Lab 12/19/18 1227  NA 137  K 4.2  CL 99  CO2 26    GLUCOSE 134*  BUN 12  CREATININE 1.09*  CALCIUM 9.7   GFR: Estimated Creatinine Clearance: 51.7 mL/min (A) (by C-G formula based on SCr of 1.09 mg/dL (H)). Liver Function Tests: No results for input(s): AST, ALT, ALKPHOS, BILITOT, PROT, ALBUMIN in the last 168 hours. No results for input(s): LIPASE, AMYLASE in the last 168 hours. No results for input(s): AMMONIA in the last  168 hours. Coagulation Profile: No results for input(s): INR, PROTIME in the last 168 hours. Cardiac Enzymes: No results for input(s): CKTOTAL, CKMB, CKMBINDEX, TROPONINI in the last 168 hours. BNP (last 3 results) No results for input(s): PROBNP in the last 8760 hours. HbA1C: No results for input(s): HGBA1C in the last 72 hours. CBG: No results for input(s): GLUCAP in the last 168 hours. Lipid Profile: No results for input(s): CHOL, HDL, LDLCALC, TRIG, CHOLHDL, LDLDIRECT in the last 72 hours. Thyroid Function Tests: No results for input(s): TSH, T4TOTAL, FREET4, T3FREE, THYROIDAB in the last 72 hours. Anemia Panel: No results for input(s): VITAMINB12, FOLATE, FERRITIN, TIBC, IRON, RETICCTPCT in the last 72 hours. Urine analysis:    Component Value Date/Time   COLORURINE YELLOW (A) 12/19/2018 1227   APPEARANCEUR HAZY (A) 12/19/2018 1227   LABSPEC 1.009 12/19/2018 1227   PHURINE 8.0 12/19/2018 1227   GLUCOSEU NEGATIVE 12/19/2018 1227   HGBUR NEGATIVE 12/19/2018 [place] 12/19/2018 1227   KETONESUR NEGATIVE 12/19/2018 1227   PROTEINUR NEGATIVE 12/19/2018 1227   NITRITE NEGATIVE 12/19/2018 1227   LEUKOCYTESUR SMALL (A) 12/19/2018 1227    Radiological Exams on Admission: Dg Chest 2 View  Result Date: 12/19/2018 CLINICAL DATA:  Dizziness for 3 days, nausea, history hypertension, GERD EXAM: CHEST - 2 VIEW COMPARISON:  07/04/2018 1,010 FINDINGS: Normal heart size, mediastinal contours, and pulmonary vascularity. Atherosclerotic calcification aorta. Lungs clear. No infiltrate, pleural  effusion or pneumothorax. Bones demineralized. IMPRESSION: No acute abnormalities. Aortic atherosclerosis. Electronically Signed   By: Lavonia Dana M.D.   On: 12/19/2018 16:32   Ct Head Wo Contrast  Result Date: 12/19/2018 CLINICAL DATA:  Vertigo. Left leg weakness. Dizziness and headache. EXAM: CT HEAD WITHOUT CONTRAST TECHNIQUE: Contiguous axial images were obtained from the base of the skull through the vertex without intravenous contrast. COMPARISON:  08/12/2006 FINDINGS: Brain: The brain shows a normal appearance without evidence of malformation, atrophy, old or acute small or large vessel infarction, mass lesion, hemorrhage, hydrocephalus or extra-axial collection. Vascular: There is atherosclerotic calcification of the major vessels at the base of the brain. Skull: Normal.  No traumatic finding.  No focal bone lesion. Sinuses/Orbits: Sinuses are clear. Orbits appear normal. Mastoids are clear. Other: None significant IMPRESSION: Negative head CT for age. No abnormality seen to explain the clinical presentation. Electronically Signed   By: Nelson Chimes M.D.   On: 12/19/2018 14:47   Mr Brain Wo Contrast  Result Date: 12/19/2018 CLINICAL DATA:  Acute presentation today with dizziness. Left leg weakness. EXAM: MRI HEAD WITHOUT CONTRAST TECHNIQUE: Multiplanar, multiecho pulse sequences of the brain and surrounding structures were obtained without intravenous contrast. COMPARISON:  Head CT same day FINDINGS: Brain: Diffusion imaging does not show any acute or subacute infarction. The brainstem and cerebellum are normal. Cerebral hemispheres are normal except for a very few punctate foci of T2 and FLAIR signal in the frontal white matter not likely to be significant. No large vessel territory infarction. No mass lesion, hemorrhage, hydrocephalus or extra-axial collection. Vascular: Major vessels at the base of the brain show flow. Skull and upper cervical spine: Negative Sinuses/Orbits: Clear/normal Other:  None IMPRESSION: No acute finding. Few punctate frontal white matter foci not likely of clinical significance. Otherwise normal exam. Electronically Signed   By: Nelson Chimes M.D.   On: 12/19/2018 17:54   Mr Lumbar Spine Wo Contrast  Result Date: 12/19/2018 CLINICAL DATA:  Acute presentation with hypertension, dizziness and left leg weakness. EXAM: MRI LUMBAR SPINE WITHOUT  CONTRAST TECHNIQUE: Multiplanar, multisequence MR imaging of the lumbar spine was performed. No intravenous contrast was administered. COMPARISON:  None. FINDINGS: Segmentation:  5 lumbar type vertebral bodies. Alignment:  Normal Vertebrae: Normal. Insignificant hemangiomas in the right side of L1, L4 and S1 Conus medullaris and cauda equina: Conus extends to the T12-L1 level. Conus and cauda equina appear normal. Paraspinal and other soft tissues: Negative Disc levels: No abnormality at T11-12, T12-L1 or L1-2. L2-3: Mild desiccation and minimal bulging of the disc. No stenosis. L3-4: Desiccation and mild bulging of the disc. No compressive stenosis. L4-5: Normal interspace. L5-S1: Normal interspace. IMPRESSION: No cause of acute left leg symptoms is identified. Non-compressive disc bulges, larger at L3-4 than at L2-3. Electronically Signed   By: Nelson Chimes M.D.   On: 12/19/2018 17:57    EKG: Independently reviewed.  Sinus rhythm at 88 normal intervals.  Assessment/Plan Principal Problem:   Dizziness Active Problems:   OBSTRUCTIVE SLEEP APNEA   Essential hypertension   Adult hypothyroidism Dizziness: Symptom presentation highly suspicious of vertigo, neurological exam within normal limits no nystagmus. We will however observe patient and admit overnight with imaging. Obstructive sleep apnea: Patient to continue CPAP per home settings. Hypertension: Blood pressure control is fair.  As we are concerned for an underlying TIA or possible stroke we will be cautious to allow permissive hypertension and not drop blood pressure to  further worsen her condition. Patient given aspirin 162 in the ED, will continue statin therapy. Hypothyroidism: TFTs in the morning we will continue patient's levothyroxine 100 mcg.  DVT prophylaxis: SCD (Lovenox/Heparin/SCD's/anticoagulated/None (if comfort care) Code Status: Full code (Full/Partial (specify details) Family Communication: Spouse at bed (Specify name, relationship. Do not write "discussed with patient". Specify tel # if discussed over the phone) Disposition Plan: Anticipate discharge tomorrow (specify when and where you expect patient to be discharged) Consults called: None with names) Admission status: Observation inpatient / obs / tele / medical floor / SDU)   Para Skeans MD [facility] If 7PM-7AM, please contact night-coverage [email or web address] Password [facility]  12/19/2018, 7:32 PM

## 2018-12-20 ENCOUNTER — Observation Stay (HOSPITAL_BASED_OUTPATIENT_CLINIC_OR_DEPARTMENT_OTHER)
Admit: 2018-12-20 | Discharge: 2018-12-20 | Disposition: A | Discharge status: Home / Self Care | Payer: Medicare Other | Attending: Internal Medicine | Admitting: Internal Medicine

## 2018-12-20 ENCOUNTER — Observation Stay: Payer: Medicare Other

## 2018-12-20 ENCOUNTER — Other Ambulatory Visit: Payer: Self-pay

## 2018-12-20 DIAGNOSIS — R42 Dizziness and giddiness: Secondary | ICD-10-CM | POA: Diagnosis not present

## 2018-12-20 DIAGNOSIS — G4733 Obstructive sleep apnea (adult) (pediatric): Secondary | ICD-10-CM | POA: Diagnosis not present

## 2018-12-20 DIAGNOSIS — I10 Essential (primary) hypertension: Secondary | ICD-10-CM | POA: Diagnosis not present

## 2018-12-20 DIAGNOSIS — G459 Transient cerebral ischemic attack, unspecified: Secondary | ICD-10-CM

## 2018-12-20 LAB — LIPID PANEL
Cholesterol: 159 mg/dL (ref 0–200)
HDL: 50 mg/dL (ref >40)
LDL Cholesterol: 83 mg/dL (ref 0–99)
Total CHOL/HDL Ratio: 3.2 RATIO
Triglycerides: 129 mg/dL (ref <150)
VLDL: 26 mg/dL (ref 0–40)

## 2018-12-20 LAB — ECHOCARDIOGRAM COMPLETE
Height: 62 in
Weight: 3206.4 oz

## 2018-12-20 LAB — SARS CORONAVIRUS 2 (TAT 6-24 HRS): SARS Coronavirus 2: NEGATIVE

## 2018-12-20 LAB — HEMOGLOBIN A1C
Hgb A1c MFr Bld: 5.7 % — ABNORMAL HIGH (ref 4.8–5.6)
Mean Plasma Glucose: 116.89 mg/dL

## 2018-12-20 LAB — HIV ANTIBODY (ROUTINE TESTING W REFLEX): HIV Screen 4th Generation wRfx: NONREACTIVE — AB

## 2018-12-20 MED ORDER — IRBESARTAN 150 MG PO TABS
150.0000 mg | ORAL_TABLET | Freq: Every day | ORAL | Status: DC
  Administered 2018-12-20: 150 mg via ORAL

## 2018-12-20 MED ORDER — PNEUMOCOCCAL VAC POLYVALENT 25 MCG/0.5ML IJ INJ
0.5000 mL | INJECTION | INTRAMUSCULAR | Status: AC
  Administered 2018-12-20: 0.5 mL via INTRAMUSCULAR
  Filled 2018-12-20: qty 0.5

## 2018-12-20 NOTE — Progress Notes (Signed)
IVs removed before discharge. Educated patient and husband on discharge instructions.

## 2018-12-20 NOTE — TOC Initial Note (Signed)
Transition of Care [facility]) - Initial/Assessment Note    Patient Details  Name: [name] MRN: [id number] Date of Birth: September 17, 1950  Transition of Care [facility]) CM/SW Contact:    Shelbie Hutching, RN Phone Number: 12/20/2018, 8:35 AM  Clinical Narrative:                 Patient placed in observation for dizziness to rule out stroke.  Stoke work up is negative.  Patient is from home where she lives with her husband.  Patient reports that she is independent and requires no assistive devices.  Patient drives, she is current with PCP and uses [facility].   RNCM waiting on PT eval for recommendations.    Expected Discharge Plan: Home/Self Care Barriers to Discharge: Continued Medical Work up   Patient Goals and CMS Choice Patient states their goals for this hospitalization and ongoing recovery are:: to go home      Expected Discharge Plan and Services Expected Discharge Plan: Home/Self Care       Living arrangements for the past 2 months: Single Family Home                                      Prior Living Arrangements/Services Living arrangements for the past 2 months: Single Family Home Lives with:: Spouse Patient language and need for interpreter reviewed:: Yes Do you feel safe going back to the place where you live?: Yes      Need for Family Participation in Patient Care: Yes (Comment)(dizziness weakness) Care giver support system in place?: Yes (comment)(husband)   Criminal Activity/Legal Involvement Pertinent to Current Situation/Hospitalization: No - Comment as needed  Activities of Daily Living Home Assistive Devices/Equipment: None, CPAP ADL Screening (condition at time of admission) Patient's cognitive ability adequate to safely complete daily activities?: Yes Is the patient deaf or have difficulty hearing?: No Does the patient have difficulty seeing, even when wearing glasses/contacts?: No Does the patient have difficulty concentrating, remembering, or  making decisions?: No Patient able to express need for assistance with ADLs?: Yes Does the patient have difficulty dressing or bathing?: No Independently performs ADLs?: Yes (appropriate for developmental age) Does the patient have difficulty walking or climbing stairs?: Yes Weakness of Legs: Left(just going upsteps" pt stated) Weakness of Arms/Hands: None  Permission Sought/Granted Permission sought to share information with : Case Manager                Emotional Assessment Appearance:: Appears stated age Attitude/Demeanor/Rapport: Engaged Affect (typically observed): Accepting Orientation: : Oriented to Self, Oriented to Place, Oriented to  Time, Oriented to Situation Alcohol / Substance Use: Not Applicable Psych Involvement: No (comment)  Admission diagnosis:  Dizziness [R42] Vertigo [R42] Left leg weakness [R29.898] Patient Active Problem List   Diagnosis Date Noted  . Dizziness 12/19/2018  . Thyroid disease   . Hyperlipidemia   . GERD (gastroesophageal reflux disease)   . Dependent edema 06/11/2014  . Elevated blood uric acid level 06/11/2014  . Adult hypothyroidism 06/11/2014  . Apnea, sleep 06/11/2014  . Headache, migraine 06/11/2014  . Arthritis, degenerative 06/11/2014  . Arthritis 06/11/2014  . Soreness breast 06/11/2014  . Urinary incontinence 09/12/2013  . Menopause 03/04/2011  . PALPITATIONS 10/08/2009  . Dyslipidemia 07/12/2008  . OBSTRUCTIVE SLEEP APNEA 07/12/2008  . MITRAL REGURGITATION, 0 (MILD) 07/12/2008  . Essential hypertension 07/12/2008  . GERD 07/12/2008   PCP:  Rusty Aus,  MD Pharmacy:   [id number] [address], [place] [address] Phone: [phone] Fax: [phone]     Social Determinants of Health (SDOH) Interventions    Readmission Risk Interventions No flowsheet data found.

## 2018-12-20 NOTE — Care Management CC44 (Signed)
Condition Code 44 Documentation Completed  Patient Details  Name: [name] MRN: [id number] Date of Birth: 1950/04/19   Condition Code 44 given:  Yes Patient signature on Condition Code 44 notice:    Documentation of 2 MD's agreement:  Yes Code 44 added to claim:  Yes    Shelbie Hutching, RN 12/20/2018, 8:29 AM

## 2018-12-20 NOTE — Plan of Care (Signed)
  Problem: Education: Goal: Knowledge of secondary prevention will improve Outcome: Progressing Goal: Knowledge of patient specific risk factors addressed and post discharge goals established will improve Outcome: Progressing   Problem: Coping: Goal: Will verbalize positive feelings about self Outcome: Progressing Goal: Will identify appropriate support needs Outcome: Progressing   Problem: Health Behavior/Discharge Planning: Goal: Ability to manage health-related needs will improve Outcome: Progressing   Problem: Self-Care: Goal: Ability to participate in self-care as condition permits will improve Outcome: Progressing Goal: Verbalization of feelings and concerns over difficulty with self-care will improve Outcome: Progressing Goal: Ability to communicate needs accurately will improve Outcome: Progressing   Problem: Nutrition: Goal: Risk of aspiration will decrease Outcome: Progressing   Problem: Ischemic Stroke/TIA Tissue Perfusion: Goal: Complications of ischemic stroke/TIA will be minimized Outcome: Progressing

## 2018-12-20 NOTE — Progress Notes (Signed)
OT Cancellation Note  Patient Details Name: [name] MRN: [id number] DOB: February 19, 1950   Cancelled Treatment:    Reason Eval/Treat Not Completed: OT screened, no needs identified, will sign off Order received for OT evaluation and chart reviewed.  Pt screened for OT needs and does not present with any needs.  Screening only completed.  Thank you for the referral.  Chrys Racer, OTR/L, [facility] ascom [id number] 12/20/18, 1:48 PM

## 2018-12-20 NOTE — Progress Notes (Signed)
   12/20/18 0930  Orthostatic Lying   BP- Lying 134/80  Pulse- Lying 89  Orthostatic Sitting  BP- Sitting 133/78  Pulse- Sitting 86  Orthostatic Standing at 0 minutes  BP- Standing at 0 minutes 111/53 (no symptoms)  Pulse- Standing at 0 minutes 86  Orthostatic Standing at 3 minutes  BP- Standing at 3 minutes 124/66  Pulse- Standing at 3 minutes 130 (no symptoms)    Orthostatic BP taken during PT evaluation. Pt asymptomatic.   9:59 AM, 12/20/18 Etta Grandchild, PT, DPT Physical Therapist - [facility]  [phone] [facility])

## 2018-12-20 NOTE — Discharge Summary (Signed)
Physician Discharge Summary  [name] [id number] DOB: 29-Aug-1950 DOA: 12/19/2018  PCP: Kylie Aus, MD  Admit date: 12/19/2018 Discharge date: 12/20/2018  Discharge disposition: Home   Recommendations for Outpatient Follow-Up:   Outpatient follow-up with PCP in 1 week.   Discharge Diagnosis:   Principal Problem:   Dizziness Active Problems:   OBSTRUCTIVE SLEEP APNEA   Essential hypertension   Adult hypothyroidism    Discharge Condition: Stable.  Diet recommendation: Low salt diet  Code status: Full code    Hospital Course:   [name] is a 68 year old woman with medical history anxiety, hypertension, morbid obesity, OSA, thyroid disease.  She presented to the hospital because of 3-day history of dizziness which she described as vertigo, and leg weakness.  Work-up for stroke was negative. She was admitted to telemetry for observation.  Fortunately, all her symptoms have resolved and she was able to ambulate with PT without any problems.  2D echo and carotid duplex were unremarkable. She is deemed stable for discharge to home today.     Medical Consultants:    None.   Discharge Exam:   Vitals:   12/20/18 1235 12/20/18 1359  BP: (!) 148/81 133/65  Pulse: 87 92  Resp:  18  Temp: 97.6 F (36.4 C) 98.2 F (36.8 C)  SpO2: 100% 99%   Vitals:   12/20/18 0604 12/20/18 0956 12/20/18 1235 12/20/18 1359  BP: (!) 121/59  (!) 148/81 133/65  Pulse: 77 (!) 140 87 92  Resp: 18   18  Temp: 97.6 F (36.4 C)  97.6 F (36.4 C) 98.2 F (36.8 C)  TempSrc: Oral  Oral Oral  SpO2: 100%  100% 99%  Weight:      Height:         GEN: NAD SKIN: No rash EYES: EOMI, PERRLA, no nystagmus ENT: MMM CV: RRR PULM: CTA B ABD: soft, obese, NT, +BS CNS: AAO x 3, non focal EXT: No edema or tenderness   The results of significant diagnostics from this hospitalization (including imaging, microbiology, ancillary and laboratory) are listed below for  reference.     Procedures and Diagnostic Studies:   US Carotid Bilateral (at [facility] Only)  Result Date: 12/20/2018 CLINICAL DATA:  Hypertension, syncope, visual disturbance EXAM: BILATERAL CAROTID DUPLEX ULTRASOUND TECHNIQUE: Pearline Cables scale imaging, color Doppler and duplex ultrasound were performed of bilateral carotid and vertebral arteries in the neck. COMPARISON:  None. FINDINGS: Criteria: Quantification of carotid stenosis is based on velocity parameters that correlate the residual internal carotid diameter with NASCET-based stenosis levels, using the diameter of the distal internal carotid lumen as the denominator for stenosis measurement. The following velocity measurements were obtained: RIGHT ICA: 94/31 cm/sec CCA: 123456 cm/sec SYSTOLIC ICA/CCA RATIO:  1.2 ECA: 104 cm/sec LEFT ICA: 72/22 cm/sec CCA: 0000000 cm/sec SYSTOLIC ICA/CCA RATIO:  0.7 ECA: 131 cm/sec RIGHT CAROTID ARTERY: Minor echogenic shadowing plaque formation. No hemodynamically significant right ICA stenosis, velocity elevation, or turbulent flow. Degree of narrowing less than 50%. RIGHT VERTEBRAL ARTERY:  Antegrade LEFT CAROTID ARTERY: Similar scattered minor echogenic plaque formation. No hemodynamically significant left ICA stenosis, velocity elevation, or turbulent flow. LEFT VERTEBRAL ARTERY:  Antegrade IMPRESSION: Minor carotid atherosclerosis. No hemodynamically significant ICA stenosis by ultrasound. Degree of narrowing estimated at less than 50% bilaterally by ultrasound criteria. Patent antegrade vertebral flow bilaterally Electronically Signed   By: Jerilynn Mages.  Shick M.D.   On: 12/20/2018 08:25     Labs:   Basic Metabolic Panel: Recent Labs  Lab 12/19/18 1227  NA 137  K 4.2  CL 99  CO2 26  GLUCOSE 134*  BUN 12  CREATININE 1.09*  CALCIUM 9.7   GFR Estimated Creatinine Clearance: 53.2 mL/min (A) (by C-G formula based on SCr of 1.09 mg/dL (H)). Liver Function Tests: No results for input(s): AST, ALT, ALKPHOS,  BILITOT, PROT, ALBUMIN in the last 168 hours. No results for input(s): LIPASE, AMYLASE in the last 168 hours. No results for input(s): AMMONIA in the last 168 hours. Coagulation profile No results for input(s): INR, PROTIME in the last 168 hours.  CBC: Recent Labs  Lab 12/19/18 1227  WBC 8.4  HGB 13.0  HCT 40.0  MCV 87.0  PLT 283   Cardiac Enzymes: No results for input(s): CKTOTAL, CKMB, CKMBINDEX, TROPONINI in the last 168 hours. BNP: Invalid input(s): POCBNP CBG: No results for input(s): GLUCAP in the last 168 hours. D-Dimer No results for input(s): DDIMER in the last 72 hours. Hgb A1c Recent Labs    12/20/18 0435  HGBA1C 5.7*   Lipid Profile Recent Labs    12/20/18 0435  CHOL 159  HDL 50  LDLCALC 83  TRIG 129  CHOLHDL 3.2   Thyroid function studies No results for input(s): TSH, T4TOTAL, T3FREE, THYROIDAB in the last 72 hours.  Invalid input(s): FREET3 Anemia work up No results for input(s): VITAMINB12, FOLATE, FERRITIN, TIBC, IRON, RETICCTPCT in the last 72 hours. Microbiology Recent Results (from the past 240 hour(s))  SARS CORONAVIRUS 2 (TAT 6-24 HRS) Nasopharyngeal Nasopharyngeal Swab     Status: None   Collection Time: 12/19/18  5:02 PM   Specimen: Nasopharyngeal Swab  Result Value Ref Range Status   SARS Coronavirus 2 NEGATIVE NEGATIVE Final    Comment: (NOTE) SARS-CoV-2 target nucleic acids are NOT DETECTED. The SARS-CoV-2 RNA is generally detectable in upper and lower respiratory specimens during the acute phase of infection. Negative results do not preclude SARS-CoV-2 infection, do not rule out co-infections with other pathogens, and should not be used as the sole basis for treatment or other patient management decisions. Negative results must be combined with clinical observations, patient history, and epidemiological information. The expected result is Negative. Fact Sheet for Patients: SugarRoll.be Fact Sheet  for Healthcare Providers: https://www.woods-mathews.com/ This test is not yet approved or cleared by the [place] FDA and  has been authorized for detection and/or diagnosis of SARS-CoV-2 by FDA under an Emergency Use Authorization (EUA). This EUA will remain  in effect (meaning this test can be used) for the duration of the COVID-19 declaration under Section 56 4(b)(1) of the Act, 21 U.S.C. section 360bbb-3(b)(1), unless the authorization is terminated or revoked sooner. Performed at [facility], [place] [address] [place], [place] [address]      Discharge Instructions:   Discharge Instructions    Diet - low sodium heart healthy   Complete by: As directed    Increase activity slowly   Complete by: As directed      Allergies as of 12/20/2018      Reactions   Diclofenac Anaphylaxis, Other (See Comments), Shortness Of Breath   Hypotension, face edema.   Codeine    Fluconazole In Dextrose    dizzy   Sulfa Antibiotics Itching   Paroxetine Hcl Anxiety      Medication List    TAKE these medications   aspirin 81 MG tablet Take 162 mg by mouth daily.   BIOTIN PO Take 100 mg by mouth.   cholecalciferol 1000 units tablet  Commonly known as: VITAMIN D Take 1,000 Units by mouth daily.   Colcrys 0.6 MG tablet Generic drug: colchicine Take 1 tablet by mouth as needed.   diazepam 10 MG tablet Commonly known as: VALIUM Take 2.5 mg by mouth every 6 (six) hours as needed.   levothyroxine 100 MCG tablet Commonly known as: SYNTHROID Take 1 tablet by mouth daily.   olmesartan 20 MG tablet Commonly known as: BENICAR Take 20 mg by mouth daily.   potassium chloride 10 MEQ CR tablet Commonly known as: KLOR-CON Take 10 mEq by mouth daily.   rosuvastatin 40 MG tablet Commonly known as: CRESTOR Take 40 mg by mouth daily.   temazepam 15 MG capsule Commonly known as: RESTORIL Take 15 mg by mouth as needed.   torsemide 10 MG tablet Commonly known as:  DEMADEX Take 10 mg by mouth daily.   traMADol 50 MG tablet Commonly known as: ULTRAM Take 50 mg by mouth daily as needed.         Time coordinating discharge: 28 minutes  Signed:  Jennye Boroughs  Pager [phone] [facility] 12/20/2018, 2:17 PM

## 2018-12-20 NOTE — Progress Notes (Signed)
*  PRELIMINARY RESULTS* Echocardiogram 2D Echocardiogram has been performed.  [name] 12/20/2018, 9:00 AM

## 2018-12-20 NOTE — Care Management Obs Status (Signed)
[place] NOTIFICATION   Patient Details  Name: [name] MRN: [id number] Date of Birth: 1950/07/01   Medicare Observation Status Notification Given:  Yes    Shelbie Hutching, RN 12/20/2018, 8:29 AM

## 2018-12-20 NOTE — Evaluation (Signed)
Physical Therapy Evaluation Patient Details Name: [name] MRN: [id number] DOB: 01-Aug-1950 Today's Date: 12/20/2018   History of Present Illness  [name] is a 82yoF who comes [facility] on 11/17 after having vertigo episodes when coming to standing. Pt reports episodes lasted estimated 30 minutes. Pt denies any onset symptoms associated with rolling in bed.  Clinical Impression  Pt admitted with above diagnosis. Pt currently with functional limitations due to the deficits listed below (see "PT Problem List"). Upon entry, pt in bed, awake and agreeable to participate. The pt is alert and oriented x4, pleasant, conversational, and generally a good historian. Pt has HA this date, but no dizziness. She reports only havign antivert thisd ate, no other medications as of yet. Orthostatic BP is positive in standing with slight improvement at 3 minutes, but pt is never with any prodrome. Sustained standing with and without walking results in progressive increase in tachycardia, 130bpm coming out of BR and 140bpm after AMB around loop. Dix Hallpike testing is negative bilat, no symptoms and no nystagmus. Functional mobility assessment demonstrates baseline performance across the board. Pt will benefit from skilled PT intervention to increase independence and safety with basic mobility in preparation for discharge to the venue listed below.       Follow Up Recommendations No PT follow up    Equipment Recommendations  None recommended by PT    Recommendations for Other Services       Precautions / Restrictions Precautions Precautions: None Restrictions Weight Bearing Restrictions: No      Mobility  Bed Mobility Overal bed mobility: Independent                Transfers Overall transfer level: Independent                  Ambulation/Gait Ambulation/Gait assistance: Independent Gait Distance (Feet): 240 Feet Assistive device: None Gait Pattern/deviations: WFL(Within  Functional Limits)     General Gait Details: being cautious to AMB more slowly, no symptoms. HR begins in 100s with gradual progression to 130s sustained peaking at 140 momentarily, good recovery to 90s after sitting x 2 minutes  Stairs            Wheelchair Mobility    Modified Rankin (Stroke Patients Only)       Balance Overall balance assessment: No apparent balance deficits (not formally assessed);Independent                                           Pertinent Vitals/Pain Pain Assessment: 0-10 Pain Score: 4  Pain Location: HA Pain Descriptors / Indicators: Aching Pain Intervention(s): Limited activity within patient's tolerance;Monitored during session    [place] expects to be discharged to:: Private residence Living Arrangements: Spouse/significant other Available Help at Discharge: Family(husband) Type of Home: House Home Access: Stairs to enter;Level entry     Home Layout: Multi-level Home Equipment: None Additional Comments: 3week left leg pain insidious    Prior Function Level of Independence: Independent         Comments: PT caregiver for an elder     Hand Dominance   Dominant Hand: Right    Extremity/Trunk Assessment   Upper Extremity Assessment Upper Extremity Assessment: Overall WFL for tasks assessed    Lower Extremity Assessment Lower Extremity Assessment: Overall WFL for tasks assessed       Communication   Communication:  No difficulties  Cognition Arousal/Alertness: Awake/alert Behavior During Therapy: WFL for tasks assessed/performed Overall Cognitive Status: Within Functional Limits for tasks assessed                                        General Comments      Exercises     Assessment/Plan    PT Assessment Patient needs continued PT services  PT Problem List Decreased activity tolerance;Decreased balance;Decreased knowledge of precautions;Decreased mobility        PT Treatment Interventions DME instruction;Balance training;Gait training;Stair training;Functional mobility training;Therapeutic activities;Therapeutic exercise;Patient/family education    PT Goals (Current goals can be found in the Care Plan section)  Acute Rehab PT Goals Patient Stated Goal: resolve dizziness forever PT Goal Formulation: With patient Time For Goal Achievement: 01/03/19 Potential to Achieve Goals: Fair    Frequency Min 2X/week   Barriers to discharge        Co-evaluation               AM-PAC PT "6 Clicks" Mobility  Outcome Measure Help needed turning from your back to your side while in a flat bed without using bedrails?: None Help needed moving from lying on your back to sitting on the side of a flat bed without using bedrails?: None Help needed moving to and from a bed to a chair (including a wheelchair)?: None Help needed standing up from a chair using your arms (e.g., wheelchair or bedside chair)?: None Help needed to walk in hospital room?: None Help needed climbing 3-5 steps with a railing? : None 6 Click Score: 24    End of Session   Activity Tolerance: Patient tolerated treatment well;No increased pain;Treatment limited secondary to medical complications (Comment)(tachycardia with activity) Patient left: in chair;with call bell/phone within reach Nurse Communication: Mobility status PT Visit Diagnosis: Dizziness and giddiness (R42);Other symptoms and signs involving the nervous system (R29.898);Difficulty in walking, not elsewhere classified (R26.2)    Time: [id number] PT Time Calculation (min) (ACUTE ONLY): 36 min   Charges:   PT Evaluation $PT Eval Moderate Complexity: 1 Mod PT Treatments $Therapeutic Exercise: 8-22 mins        10:12 AM, 12/20/18 Etta Grandchild, PT, DPT Physical Therapist - [facility]  [phone] ([place])   , C 12/20/2018, 10:09 AM

## 2018-12-20 NOTE — Progress Notes (Signed)
SLP Cancellation Note  Patient Details Name: [name] MRN: [id number] DOB: 1950/06/29   Cancelled treatment:       Reason Eval/Treat Not Completed: SLP screened, no needs identified, will sign off(chart reviewed; consulted NSG then met w/ pt in room)  Pt denied any difficulty swallowing and is currently on a regular diet; tolerates swallowing pills w/ water per NSG. Pt conversed at conversational level w/ SLP w/out deficits noted; pt and staff denied any speech-language deficits.  No further skilled ST services indicated as pt appears at her baseline. Pt agreed. NSG to reconsult if any change in status.      Orinda Kenner, MS, CCC-SLP Faylene Allerton 12/20/2018, 12:09 PM

## 2019-06-22 ENCOUNTER — Encounter: Payer: Self-pay | Admitting: Dermatology

## 2019-06-22 ENCOUNTER — Ambulatory Visit (INDEPENDENT_AMBULATORY_CARE_PROVIDER_SITE_OTHER): Payer: Medicare Other | Admitting: Dermatology

## 2019-06-22 ENCOUNTER — Other Ambulatory Visit: Payer: Self-pay

## 2019-06-22 DIAGNOSIS — L578 Other skin changes due to chronic exposure to nonionizing radiation: Secondary | ICD-10-CM | POA: Diagnosis not present

## 2019-06-22 DIAGNOSIS — L820 Inflamed seborrheic keratosis: Secondary | ICD-10-CM | POA: Diagnosis not present

## 2019-06-22 DIAGNOSIS — L821 Other seborrheic keratosis: Secondary | ICD-10-CM | POA: Diagnosis not present

## 2019-06-22 NOTE — Progress Notes (Signed)
   Follow-Up Visit   Subjective  [name] is a 69 y.o. female who presents for the following: lesion (R thigh x 3 weeks - painful, tender) and lesion (L thigh x 1 - new and irregular shaped).  The following portions of the chart were reviewed this encounter and updated as appropriate:  Tobacco  Allergies  Meds  Problems  Med Hx  Surg Hx  Fam Hx     Review of Systems:  No other skin or systemic complaints except as noted in HPI or Assessment and Plan.  Objective  Well appearing patient in no apparent distress; mood and affect are within normal limits.  A focused examination was performed including B/L leg. Relevant physical exam findings are noted in the Assessment and Plan.  Objective  B/L leg (9): Erythematous keratotic or waxy stuck-on papule or plaque.    Assessment & Plan    Actinic Damage - diffuse scaly erythematous macules with underlying dyspigmentation - Recommend daily broad spectrum sunscreen SPF 30+ to sun-exposed areas, reapply every 2 hours as needed.  - Call for new or changing lesions.   Inflamed seborrheic keratosis (9) B/L leg  Destruction of lesion - B/L leg Complexity: simple   Destruction method: cryotherapy   Informed consent: discussed and consent obtained   Timeout:  patient name, date of birth, surgical site, and procedure verified Lesion destroyed using liquid nitrogen: Yes   Region frozen until ice ball extended beyond lesion: Yes   Outcome: patient tolerated procedure well with no complications   Post-procedure details: wound care instructions given    Destruction of lesion - B/L leg Complexity: simple   Destruction method: cryotherapy   Informed consent: discussed and consent obtained   Timeout:  patient name, date of birth, surgical site, and procedure verified Lesion destroyed using liquid nitrogen: Yes   Region frozen until ice ball extended beyond lesion: Yes   Outcome: patient tolerated procedure well with no  complications   Post-procedure details: wound care instructions given     Seborrheic Keratoses - Stuck-on, waxy, tan-brown papules and plaques  - Discussed benign etiology and prognosis. - Observe - Call for any changes  Return for TBSE, appointment as scheduled.  Luther Redo, CMA, am acting as scribe for Sarina Ser, MD . Documentation: I have reviewed the above documentation for accuracy and completeness, and I agree with the above.  Sarina Ser, MD

## 2019-06-25 ENCOUNTER — Encounter: Payer: Self-pay | Admitting: Dermatology

## 2019-09-12 ENCOUNTER — Telehealth: Payer: Self-pay

## 2019-09-12 NOTE — Telephone Encounter (Signed)
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

## 2019-09-21 ENCOUNTER — Ambulatory Visit: Payer: Medicare Other | Admitting: Dermatology

## 2019-10-04 ENCOUNTER — Encounter: Payer: Self-pay | Admitting: Dermatology

## 2019-10-04 ENCOUNTER — Other Ambulatory Visit: Payer: Self-pay | Admitting: Dermatology

## 2019-10-04 ENCOUNTER — Ambulatory Visit (INDEPENDENT_AMBULATORY_CARE_PROVIDER_SITE_OTHER): Payer: Medicare Other | Admitting: Dermatology

## 2019-10-04 ENCOUNTER — Other Ambulatory Visit: Payer: Self-pay

## 2019-10-04 DIAGNOSIS — L650 Telogen effluvium: Secondary | ICD-10-CM | POA: Diagnosis not present

## 2019-10-04 DIAGNOSIS — D229 Melanocytic nevi, unspecified: Secondary | ICD-10-CM

## 2019-10-04 DIAGNOSIS — Z8639 Personal history of other endocrine, nutritional and metabolic disease: Secondary | ICD-10-CM

## 2019-10-04 DIAGNOSIS — L814 Other melanin hyperpigmentation: Secondary | ICD-10-CM

## 2019-10-04 DIAGNOSIS — Z1283 Encounter for screening for malignant neoplasm of skin: Secondary | ICD-10-CM

## 2019-10-04 DIAGNOSIS — L578 Other skin changes due to chronic exposure to nonionizing radiation: Secondary | ICD-10-CM

## 2019-10-04 DIAGNOSIS — D239 Other benign neoplasm of skin, unspecified: Secondary | ICD-10-CM

## 2019-10-04 DIAGNOSIS — D485 Neoplasm of uncertain behavior of skin: Secondary | ICD-10-CM

## 2019-10-04 DIAGNOSIS — L821 Other seborrheic keratosis: Secondary | ICD-10-CM

## 2019-10-04 DIAGNOSIS — D1800 Hemangioma unspecified site: Secondary | ICD-10-CM

## 2019-10-04 DIAGNOSIS — R221 Localized swelling, mass and lump, neck: Secondary | ICD-10-CM | POA: Diagnosis not present

## 2019-10-04 DIAGNOSIS — L659 Nonscarring hair loss, unspecified: Secondary | ICD-10-CM

## 2019-10-04 DIAGNOSIS — L720 Epidermal cyst: Secondary | ICD-10-CM

## 2019-10-04 HISTORY — DX: Other benign neoplasm of skin, unspecified: D23.9

## 2019-10-04 NOTE — Progress Notes (Signed)
Follow-Up Visit   Subjective  [name] is a 69 y.o. female who presents for the following: Annual Exam (hx dysplastic nevus of the abdomen -  Patient would like to discuss hairloss and has a history of hypothyroidism). She is currently taking Biotin but is unsure of dose. Patient has also concerned about a mass of the anterior neck. The patient presents for Total-Body Skin Exam (TBSE) for skin cancer screening and mole check.  The following portions of the chart were reviewed this encounter and updated as appropriate:  Tobacco  Allergies  Meds  Problems  Med Hx  Surg Hx  Fam Hx     Review of Systems:  No other skin or systemic complaints except as noted in HPI or Assessment and Plan.  Objective  Well appearing patient in no apparent distress; mood and affect are within normal limits.  A full examination was performed including scalp, head, eyes, ears, nose, lips, neck, chest, axillae, abdomen, back, buttocks, bilateral upper extremities, bilateral lower extremities, hands, feet, fingers, toes, fingernails, and toenails. All findings within normal limits unless otherwise noted below.  Objective  Scalp: Areas of thinning  Images      Objective  Anterior neck: Soft to palpation   Images    Objective  R shoulder: 0.6 cm irregular brown macule   Assessment & Plan  Alopecia Scalp Telogen effluvium - patient has a history of hypothyroidism -  Pt states recent Thyroid labs were normal.  Discussed stress, illness, and COVID vaccine may be a cause hair loss.  Patient did stop Levothroxine and restarted it a month later which may be the cause of recent hair loss. Recommend starting Rogaine for Women daily and  Biotin 2.5mg  po QD.   Mass in neck Anterior neck Likely fatty tissue not consistent with an enlarged thyroid - patient to follow up with her PCP Dr. Emily Filbert who will evaluate this and discuss further diagnostics if appropriate.   Neoplasm of  uncertain behavior of skin R shoulder  Epidermal / dermal shaving  Lesion diameter (cm):  0.6 Informed consent: discussed and consent obtained   Timeout: patient name, date of birth, surgical site, and procedure verified   Procedure prep:  Patient was prepped and draped in usual sterile fashion Prep type:  Isopropyl alcohol Anesthesia: the lesion was anesthetized in a standard fashion   Anesthetic:  1% lidocaine w/ epinephrine 1-100,000 buffered w/ 8.4% NaHCO3 Instrument used: flexible razor blade   Hemostasis achieved with: pressure, aluminum chloride and electrodesiccation   Outcome: patient tolerated procedure well   Post-procedure details: sterile dressing applied and wound care instructions given   Dressing type: bandage and petrolatum    Specimen 1 - Surgical pathology Differential Diagnosis: D48.5 r/o dysplastic nevus Check Margins: No 0.6 cm irregular brown macule  Epidermal inclusion cyst R xyphoid area Discussed surgical option.  Patient may consider  Lentigines - Scattered tan macules - Discussed due to sun exposure - Benign, observe - Call for any changes  Seborrheic Keratoses - Stuck-on, waxy, tan-brown papules and plaques  - Discussed benign etiology and prognosis. - Observe - Call for any changes  Melanocytic Nevi - Tan-brown and/or pink-flesh-colored symmetric macules and papules - Benign appearing on exam today - Observation - Call clinic for new or changing moles - Recommend daily use of broad spectrum spf 30+ sunscreen to sun-exposed areas.   Hemangiomas - Red papules - Discussed benign nature - Observe - Call for any changes  Actinic Damage - diffuse  scaly erythematous macules with underlying dyspigmentation - Recommend daily broad spectrum sunscreen SPF 30+ to sun-exposed areas, reapply every 2 hours as needed.  - Call for new or changing lesions.  Skin cancer screening performed today.  Return in about 1 year (around 10/03/2020) for  TBSE.  Luther Redo, CMA, am acting as scribe for Sarina Ser, MD .  Documentation: I have reviewed the above documentation for accuracy and completeness, and I agree with the above.  Sarina Ser, MD

## 2019-10-04 NOTE — Patient Instructions (Addendum)
Recommend starting Rogaine for Women daily and Biotin 2.5mg  po QD.   Wound Care Instructions  1. Cleanse wound gently with soap and water once a day then pat dry with clean gauze. Apply a thing coat of Petrolatum (petroleum jelly, "Vaseline") over the wound (unless you have an allergy to this). We recommend that you use a new, sterile tube of Vaseline. Do not pick or remove scabs. Do not remove the yellow or white "healing tissue" from the base of the wound.  2. Cover the wound with fresh, clean, nonstick gauze and secure with paper tape. You may use Band-Aids in place of gauze and tape if the would is small enough, but would recommend trimming much of the tape off as there is often too much. Sometimes Band-Aids can irritate the skin.  3. You should call the office for your biopsy report after 1 week if you have not already been contacted.  4. If you experience any problems, such as abnormal amounts of bleeding, swelling, significant bruising, significant pain, or evidence of infection, please call the office immediately.  5. FOR ADULT SURGERY PATIENTS: If you need something for pain relief you may take 1 extra strength Tylenol (acetaminophen) AND 2 Ibuprofen (200mg  each) together every 4 hours as needed for pain. (do not take these if you are allergic to them or if you have a reason you should not take them.) Typically, you may only need pain medication for 1 to 3 days.

## 2019-10-09 ENCOUNTER — Encounter: Payer: Self-pay | Admitting: Dermatology

## 2019-10-10 ENCOUNTER — Telehealth: Payer: Self-pay

## 2019-10-10 NOTE — Telephone Encounter (Signed)
Patient informed of pathology results 

## 2019-10-10 NOTE — Telephone Encounter (Signed)
-----   Message from Ralene Bathe, MD sent at 10/06/2019  6:22 PM EDT ----- Skin , (A) right shoulder DYSPLASTIC COMPOUND NEVUS WITH MODERATE ATYPIA,  Dysplastic Moderate Recheck next visit

## 2019-12-25 ENCOUNTER — Ambulatory Visit (INDEPENDENT_AMBULATORY_CARE_PROVIDER_SITE_OTHER): Payer: Medicare Other | Admitting: Dermatology

## 2019-12-25 ENCOUNTER — Other Ambulatory Visit: Payer: Self-pay

## 2019-12-25 DIAGNOSIS — R208 Other disturbances of skin sensation: Secondary | ICD-10-CM | POA: Diagnosis not present

## 2019-12-25 DIAGNOSIS — L299 Pruritus, unspecified: Secondary | ICD-10-CM | POA: Diagnosis not present

## 2019-12-25 DIAGNOSIS — Z86018 Personal history of other benign neoplasm: Secondary | ICD-10-CM | POA: Diagnosis not present

## 2019-12-25 NOTE — Progress Notes (Signed)
   Follow-Up Visit   Subjective  [name] is a 69 y.o. female who presents for the following: scar (at site of dysplastic nevus - R shoulder, itchy, irritated ).  The following portions of the chart were reviewed this encounter and updated as appropriate:  Tobacco  Allergies  Meds  Problems  Med Hx  Surg Hx  Fam Hx     Review of Systems:  No other skin or systemic complaints except as noted in HPI or Assessment and Plan.  Objective  Well appearing patient in no apparent distress; mood and affect are within normal limits.  A focused examination was performed including the back . Relevant physical exam findings are noted in the Assessment and Plan.  Objective  R shoulder: Dyspigmented smooth macule or patch.   Assessment & Plan  History of dysplastic nevus R shoulder With symptoms of itching and irritation - Normal scar.  Does not appear to be a keloid or hypertrophic scar at this time.  Discussed ILK injection, but patient declines at this time. Recommend Serica scar gel formula TID, samples given.    Return for TBSE, appointment as scheduled.  Luther Redo, CMA, am acting as scribe for Sarina Ser, MD .  Documentation: I have reviewed the above documentation for accuracy and completeness, and I agree with the above.  Sarina Ser, MD

## 2019-12-26 ENCOUNTER — Encounter: Payer: Self-pay | Admitting: Dermatology

## 2020-07-24 ENCOUNTER — Telehealth: Payer: Self-pay

## 2020-07-24 NOTE — Telephone Encounter (Signed)
ERROR

## 2020-07-29 ENCOUNTER — Telehealth: Payer: Self-pay

## 2020-07-29 NOTE — Telephone Encounter (Signed)
Pt called to reschedule her appt this week with Dr Raliegh Ip, her husband has an appt the same time,

## 2020-07-31 ENCOUNTER — Ambulatory Visit: Payer: Medicare Other | Admitting: Dermatology

## 2020-09-04 ENCOUNTER — Ambulatory Visit: Payer: Medicare Other | Admitting: Dermatology

## 2020-10-14 ENCOUNTER — Ambulatory Visit (INDEPENDENT_AMBULATORY_CARE_PROVIDER_SITE_OTHER): Payer: Medicare Other | Admitting: Dermatology

## 2020-10-14 ENCOUNTER — Other Ambulatory Visit: Payer: Self-pay

## 2020-10-14 DIAGNOSIS — L650 Telogen effluvium: Secondary | ICD-10-CM

## 2020-10-14 DIAGNOSIS — D485 Neoplasm of uncertain behavior of skin: Secondary | ICD-10-CM | POA: Diagnosis not present

## 2020-10-14 DIAGNOSIS — L570 Actinic keratosis: Secondary | ICD-10-CM

## 2020-10-14 DIAGNOSIS — D229 Melanocytic nevi, unspecified: Secondary | ICD-10-CM

## 2020-10-14 DIAGNOSIS — L821 Other seborrheic keratosis: Secondary | ICD-10-CM

## 2020-10-14 DIAGNOSIS — L820 Inflamed seborrheic keratosis: Secondary | ICD-10-CM | POA: Diagnosis not present

## 2020-10-14 DIAGNOSIS — Z1283 Encounter for screening for malignant neoplasm of skin: Secondary | ICD-10-CM | POA: Diagnosis not present

## 2020-10-14 DIAGNOSIS — Z86018 Personal history of other benign neoplasm: Secondary | ICD-10-CM | POA: Diagnosis not present

## 2020-10-14 DIAGNOSIS — L814 Other melanin hyperpigmentation: Secondary | ICD-10-CM

## 2020-10-14 DIAGNOSIS — D1800 Hemangioma unspecified site: Secondary | ICD-10-CM

## 2020-10-14 DIAGNOSIS — L578 Other skin changes due to chronic exposure to nonionizing radiation: Secondary | ICD-10-CM

## 2020-10-14 DIAGNOSIS — L659 Nonscarring hair loss, unspecified: Secondary | ICD-10-CM | POA: Diagnosis not present

## 2020-10-14 DIAGNOSIS — Z8639 Personal history of other endocrine, nutritional and metabolic disease: Secondary | ICD-10-CM

## 2020-10-14 NOTE — Progress Notes (Signed)
Follow-Up Visit   Subjective  [name] is a 70 y.o. female who presents for the following: TBSE (Total body exam today. No hx of skin cancer. Hx of moderate dysplastic nevus on right shoulder. Pt c/o of itching from the dysplastic site. ). Patient here for full body skin exam and skin cancer screening.  The following portions of the chart were reviewed this encounter and updated as appropriate:  Tobacco  Allergies  Meds  Problems  Med Hx  Surg Hx  Fam Hx     Review of Systems: No other skin or systemic complaints except as noted in HPI or Assessment and Plan.  Objective  Well appearing patient in no apparent distress; mood and affect are within normal limits.  A full examination was performed including scalp, head, eyes, ears, nose, lips, neck, chest, axillae, abdomen, back, buttocks, bilateral upper extremities, bilateral lower extremities, hands, feet, fingers, toes, fingernails, and toenails. All findings within normal limits unless otherwise noted below.  face x 2 (2) Erythematous thin papules/macules with gritty scale.   chest x 2 Erythematous keratotic or waxy stuck-on papule or plaque.   Right Scapula 1.2 cm pink fleshy macule   Scalp Areas of thinning       Assessment & Plan  AK (actinic keratosis) face x 2  Actinic keratoses are precancerous spots that appear secondary to cumulative UV radiation exposure/sun exposure over time. They are chronic with expected duration over 1 year. A portion of actinic keratoses will progress to squamous cell carcinoma of the skin. It is not possible to reliably predict which spots will progress to skin cancer and so treatment is recommended to prevent development of skin cancer. Recommend daily broad spectrum sunscreen SPF 30+ to sun-exposed areas, reapply every 2 hours as needed.  Recommend staying in the shade or wearing long sleeves, sun glasses (UVA+UVB protection) and wide brim hats (4-inch brim around the entire  circumference of the hat). Call for new or changing lesions.  Prior to procedure, discussed risks of blister formation, small wound, skin dyspigmentation, or rare scar following cryotherapy. Recommend Vaseline ointment to treated areas while healing.  Destruction of lesion - face x 2 Complexity: simple   Destruction method: cryotherapy   Informed consent: discussed and consent obtained   Timeout:  patient name, date of birth, surgical site, and procedure verified Lesion destroyed using liquid nitrogen: Yes   Region frozen until ice ball extended beyond lesion: Yes   Outcome: patient tolerated procedure well with no complications   Post-procedure details: wound care instructions given    Inflamed seborrheic keratosis chest x 2  Prior to procedure, discussed risks of blister formation, small wound, skin dyspigmentation, or rare scar following cryotherapy. Recommend Vaseline ointment to treated areas while healing.  Destruction of lesion - chest x 2 Complexity: simple   Destruction method: cryotherapy   Informed consent: discussed and consent obtained   Timeout:  patient name, date of birth, surgical site, and procedure verified Lesion destroyed using liquid nitrogen: Yes   Region frozen until ice ball extended beyond lesion: Yes   Outcome: patient tolerated procedure well with no complications   Post-procedure details: wound care instructions given    Neoplasm of uncertain behavior of skin Right Scapula Epidermal / dermal shaving  Lesion diameter (cm):  1.2 Informed consent: discussed and consent obtained   Timeout: patient name, date of birth, surgical site, and procedure verified   Procedure prep:  Patient was prepped and draped in usual sterile fashion Prep  type:  Isopropyl alcohol Anesthesia: the lesion was anesthetized in a standard fashion   Anesthetic:  1% lidocaine w/ epinephrine 1-100,000 buffered w/ 8.4% NaHCO3 Instrument used: flexible razor blade   Hemostasis  achieved with: pressure, aluminum chloride and electrodesiccation   Outcome: patient tolerated procedure well   Post-procedure details: sterile dressing applied and wound care instructions given   Dressing type: bandage and petrolatum    Specimen 1 - Surgical pathology Differential Diagnosis: Irritated nevus r/o dysplasia vs scar tissue Check Margins: No  Pt will call if area becomes itchy again to make an appt for ILK injections.   Alopecia Scalp Telogen effluvium - patient has a history of hypothyroidism and was temporarily off of her thyroid replacement medication that likely caused her hair loss.- Improved. Compared pictures. New pictures taken today.  Continue biotin daily.  Telogen effluvium is a benign, self limited condition causing increased hair shedding usually for several months. It does not progress to baldness. It can be triggered by recent illness, recent surgery, thyroid disease, low iron stores, vitamin D deficiency, fad diets or rapid weight loss, hormonal changes such as pregnancy or birth control pills, and some medication. Usually the hair loss starts 2-3 months after the illness or health change. Rarely, it can continue for longer than a year.  Skin cancer screening  Lentigines - Scattered tan macules - Due to sun exposure - Benign-appering, observe - Recommend daily broad spectrum sunscreen SPF 30+ to sun-exposed areas, reapply every 2 hours as needed. - Call for any changes  Seborrheic Keratoses - Stuck-on, waxy, tan-brown papules and/or plaques  - Benign-appearing - Discussed benign etiology and prognosis. - Observe - Call for any changes  Melanocytic Nevi - Tan-brown and/or pink-flesh-colored symmetric macules and papules - Benign appearing on exam today - Observation - Call clinic for new or changing moles - Recommend daily use of broad spectrum spf 30+ sunscreen to sun-exposed areas.   Hemangiomas - Red papules - Discussed benign nature -  Observe - Call for any changes  Actinic Damage - Chronic condition, secondary to cumulative UV/sun exposure - diffuse scaly erythematous macules with underlying dyspigmentation - Recommend daily broad spectrum sunscreen SPF 30+ to sun-exposed areas, reapply every 2 hours as needed.  - Staying in the shade or wearing long sleeves, sun glasses (UVA+UVB protection) and wide brim hats (4-inch brim around the entire circumference of the hat) are also recommended for sun protection.  - Call for new or changing lesions.  Skin cancer screening performed today.  History of Dysplastic Nevi - No evidence of recurrence today - Recommend regular full body skin exams - Recommend daily broad spectrum sunscreen SPF 30+ to sun-exposed areas, reapply every 2 hours as needed.  - Call if any new or changing lesions are noted between office visits  Return in about 1 year (around 10/14/2021) for TBSE.  IHarriett Sine, CMA, am acting as scribe for Sarina Ser, MD. Documentation: I have reviewed the above documentation for accuracy and completeness, and I agree with the above.  Sarina Ser, MD

## 2020-10-14 NOTE — Patient Instructions (Signed)

## 2020-10-16 ENCOUNTER — Telehealth: Payer: Self-pay

## 2020-10-16 NOTE — Telephone Encounter (Signed)
Advised patient of results/hd  

## 2020-10-16 NOTE — Telephone Encounter (Signed)
-----   Message from Ralene Bathe, MD sent at 10/15/2020  5:34 PM EDT ----- Diagnosis Skin , right scapula KELOID  Benign Keloid = thickened scar Will plan injection in future if still symptomatic

## 2020-10-18 ENCOUNTER — Encounter: Payer: Self-pay | Admitting: Dermatology

## 2020-11-14 ENCOUNTER — Other Ambulatory Visit: Payer: Self-pay

## 2020-11-14 ENCOUNTER — Telehealth: Payer: Self-pay

## 2020-11-14 ENCOUNTER — Encounter: Payer: Self-pay | Admitting: Dermatology

## 2020-11-14 MED ORDER — DOXYCYCLINE HYCLATE 100 MG PO TABS: 100.0000 mg | ORAL_TABLET | Freq: Two times a day (BID) | ORAL | 0 refills | Status: AC

## 2020-11-14 MED ORDER — MUPIROCIN 2 % EX OINT: 1.0000 "application " | TOPICAL_OINTMENT | Freq: Every day | CUTANEOUS | 0 refills | Status: AC

## 2020-11-14 NOTE — Telephone Encounter (Signed)
Pt states that she thinks most recent biopsy site may be infected. She states that it was itching some and she asked her husband to scratch it. She states that he used the pointy edge of his car keys and now the area is red and painful. Advised pt to send picture through her mychart. Please advise.

## 2020-11-18 ENCOUNTER — Telehealth: Payer: Self-pay

## 2020-11-18 NOTE — Telephone Encounter (Signed)
Pt called, she can not take Doxycycline because it is making her sick with upset stomach, she report the biopsy area on her back is not painful or oozing, she would like to use Mupirocin ointment only, she will call back if area worsens.

## 2021-07-29 ENCOUNTER — Ambulatory Visit (INDEPENDENT_AMBULATORY_CARE_PROVIDER_SITE_OTHER): Payer: Medicare Other | Admitting: Dermatology

## 2021-07-29 DIAGNOSIS — L910 Hypertrophic scar: Secondary | ICD-10-CM

## 2021-07-29 NOTE — Progress Notes (Signed)
   Follow-Up Visit   Subjective  [name] is a 71 y.o. female who presents for the following: Skin Problem (Check growth on the back, pt had this area biopsied last year, biopsy showed Keloid scar, pt reports this area is very itchy and irritating.  ).   The following portions of the chart were reviewed this encounter and updated as appropriate:   Tobacco  Allergies  Meds  Problems  Med Hx  Surg Hx  Fam Hx      Review of Systems:  No other skin or systemic complaints except as noted in HPI or Assessment and Plan.  Objective  Well appearing patient in no apparent distress; mood and affect are within normal limits.  A focused examination was performed including back. Relevant physical exam findings are noted in the Assessment and Plan.  right scapula Firm pink/brown dermal plaque with erythema    Assessment & Plan  Keloid right scapula  Biopsy proven keloid Benign Symptomatic per patient. Inflamed on exam.   NDC# 0003-0293-28 Lot # [id number] Exp July 04, 2022  Intralesional injection - right scapula Location: right scapula   Informed Consent: Discussed risks (infection, pain, bleeding, bruising, thinning of the skin, loss of skin pigment, lack of resolution, and recurrence of lesion) and benefits of the procedure, as well as the alternatives. Informed consent was obtained. Preparation: The area was prepared a standard fashion.  Anesthesia:  Procedure Details: An intralesional injection was performed with Kenalog 20 mg/cc. 0.3 cc in total were injected.  Total number of injections: 1  Plan: The patient was instructed on post-op care. Recommend OTC analgesia as needed for pain.   Return for scheduled  TBSE in Sept .  I, Marye Round, CMA, am acting as scribe for Forest Gleason, MD .   Documentation: I have reviewed the above documentation for accuracy and completeness, and I agree with the above.  Forest Gleason, MD

## 2021-08-05 ENCOUNTER — Encounter: Payer: Self-pay | Admitting: Dermatology

## 2021-08-18 ENCOUNTER — Other Ambulatory Visit: Payer: Self-pay | Admitting: Internal Medicine

## 2021-08-18 DIAGNOSIS — N170 Acute kidney failure with tubular necrosis: Secondary | ICD-10-CM

## 2021-08-18 DIAGNOSIS — I2510 Atherosclerotic heart disease of native coronary artery without angina pectoris: Secondary | ICD-10-CM

## 2021-08-20 ENCOUNTER — Ambulatory Visit
Admission: RE | Admit: 2021-08-20 | Discharge: 2021-08-20 | Disposition: A | Discharge status: Home / Self Care | Payer: Medicare Other | Source: Ambulatory Visit | Attending: Internal Medicine | Admitting: Internal Medicine

## 2021-08-20 DIAGNOSIS — N170 Acute kidney failure with tubular necrosis: Secondary | ICD-10-CM

## 2021-08-20 DIAGNOSIS — I2510 Atherosclerotic heart disease of native coronary artery without angina pectoris: Secondary | ICD-10-CM

## 2021-10-20 ENCOUNTER — Ambulatory Visit (INDEPENDENT_AMBULATORY_CARE_PROVIDER_SITE_OTHER): Payer: Medicare Other | Admitting: Dermatology

## 2021-10-20 ENCOUNTER — Encounter: Payer: Self-pay | Admitting: Dermatology

## 2021-10-20 DIAGNOSIS — L814 Other melanin hyperpigmentation: Secondary | ICD-10-CM

## 2021-10-20 DIAGNOSIS — I2510 Atherosclerotic heart disease of native coronary artery without angina pectoris: Secondary | ICD-10-CM

## 2021-10-20 DIAGNOSIS — L821 Other seborrheic keratosis: Secondary | ICD-10-CM

## 2021-10-20 DIAGNOSIS — L578 Other skin changes due to chronic exposure to nonionizing radiation: Secondary | ICD-10-CM | POA: Diagnosis not present

## 2021-10-20 DIAGNOSIS — Z1283 Encounter for screening for malignant neoplasm of skin: Secondary | ICD-10-CM

## 2021-10-20 DIAGNOSIS — D239 Other benign neoplasm of skin, unspecified: Secondary | ICD-10-CM

## 2021-10-20 DIAGNOSIS — D229 Melanocytic nevi, unspecified: Secondary | ICD-10-CM

## 2021-10-20 DIAGNOSIS — Z86018 Personal history of other benign neoplasm: Secondary | ICD-10-CM

## 2021-10-20 DIAGNOSIS — D1800 Hemangioma unspecified site: Secondary | ICD-10-CM

## 2021-10-20 DIAGNOSIS — Z808 Family history of malignant neoplasm of other organs or systems: Secondary | ICD-10-CM

## 2021-10-20 NOTE — Patient Instructions (Signed)
Recommend daily broad spectrum sunscreen SPF 30+ to sun-exposed areas, reapply every 2 hours as needed. Call for new or changing lesions.  Staying in the shade or wearing long sleeves, sun glasses (UVA+UVB protection) and wide brim hats (4-inch brim around the entire circumference of the hat) are also recommended for sun protection.    Melanoma ABCDEs  Melanoma is the most dangerous type of skin cancer, and is the leading cause of death from skin disease.  You are more likely to develop melanoma if you: Have light-colored skin, light-colored eyes, or red or blond hair Spend a lot of time in the sun Tan regularly, either outdoors or in a tanning bed Have had blistering sunburns, especially during childhood Have a close family member who has had a melanoma Have atypical moles or large birthmarks  Early detection of melanoma is key since treatment is typically straightforward and cure rates are extremely high if we catch it early.   The first sign of melanoma is often a change in a mole or a new dark spot.  The ABCDE system is a way of remembering the signs of melanoma.  A for asymmetry:  The two halves do not match. B for border:  The edges of the growth are irregular. C for color:  A mixture of colors are present instead of an even brown color. D for diameter:  Melanomas are usually (but not always) greater than 6mm - the size of a pencil eraser. E for evolution:  The spot keeps changing in size, shape, and color.  Please check your skin once per month between visits. You can use a small mirror in front and a large mirror behind you to keep an eye on the back side or your body.   If you see any new or changing lesions before your next follow-up, please call to schedule a visit.  Please continue daily skin protection including broad spectrum sunscreen SPF 30+ to sun-exposed areas, reapplying every 2 hours as needed when you're outdoors.   Staying in the shade or wearing long sleeves, sun  glasses (UVA+UVB protection) and wide brim hats (4-inch brim around the entire circumference of the hat) are also recommended for sun protection.     Due to recent changes in healthcare laws, you may see results of your pathology and/or laboratory studies on MyChart before the doctors have had a chance to review them. We understand that in some cases there may be results that are confusing or concerning to you. Please understand that not all results are received at the same time and often the doctors may need to interpret multiple results in order to provide you with the best plan of care or course of treatment. Therefore, we ask that you please give us 2 business days to thoroughly review all your results before contacting the office for clarification. Should we see a critical lab result, you will be contacted sooner.   If You Need Anything After Your Visit  If you have any questions or concerns for your doctor, please call our main line at [phone] and press option 4 to reach your doctor's medical assistant. If no one answers, please leave a voicemail as directed and we will return your call as soon as possible. Messages left after 4 pm will be answered the following business day.   You may also send us a message via MyChart. We typically respond to MyChart messages within 1-2 business days.  For prescription refills, please ask your pharmacy to contact   our office. Our fax number is [phone].  If you have an urgent issue when the clinic is closed that cannot wait until the next business day, you can page your doctor at the number below.    Please note that while we do our best to be available for urgent issues outside of office hours, we are not available 24/7.   If you have an urgent issue and are unable to reach us, you may choose to seek medical care at your doctor's office, retail clinic, urgent care center, or emergency room.  If you have a medical emergency, please immediately  call 911 or go to the emergency department.  Pager Numbers  - Dr. Kowalski: [phone]  - Dr. Moye: [phone]  - Dr. Stewart: [phone]  In the event of inclement weather, please call our main line at [phone] for an update on the status of any delays or closures.  Dermatology Medication Tips: Please keep the boxes that topical medications come in in order to help keep track of the instructions about where and how to use these. Pharmacies typically print the medication instructions only on the boxes and not directly on the medication tubes.   If your medication is too expensive, please contact our office at [phone] option 4 or send us a message through MyChart.   We are unable to tell what your co-pay for medications will be in advance as this is different depending on your insurance coverage. However, we may be able to find a substitute medication at lower cost or fill out paperwork to get insurance to cover a needed medication.   If a prior authorization is required to get your medication covered by your insurance company, please allow us 1-2 business days to complete this process.  Drug prices often vary depending on where the prescription is filled and some pharmacies may offer cheaper prices.  The website www.goodrx.com contains coupons for medications through different pharmacies. The prices here do not account for what the cost may be with help from insurance (it may be cheaper with your insurance), but the website can give you the price if you did not use any insurance.  - You can print the associated coupon and take it with your prescription to the pharmacy.  - You may also stop by our office during regular business hours and pick up a GoodRx coupon card.  - If you need your prescription sent electronically to a different pharmacy, notify our office through [facility] MyChart or by phone at [phone] option 4.     Si Usted Necesita Algo Despus de Su  Visita  Tambin puede enviarnos un mensaje a travs de MyChart. Por lo general respondemos a los mensajes de MyChart en el transcurso de 1 a 2 das hbiles.  Para renovar recetas, por favor pida a su farmacia que se ponga en contacto con nuestra oficina. Nuestro nmero de fax es el [phone].  Si tiene un asunto urgente cuando la clnica est cerrada y que no puede esperar hasta el siguiente da hbil, puede llamar/localizar a su doctor(a) al nmero que aparece a continuacin.   Por favor, tenga en cuenta que aunque hacemos todo lo posible para estar disponibles para asuntos urgentes fuera del horario de oficina, no estamos disponibles las 24 horas del da, los 7 das de la semana.   Si tiene un problema urgente y no puede comunicarse con nosotros, puede optar por buscar atencin mdica  en el consultorio de su doctor(a), en una clnica privada,   en un centro de atencin urgente o en una sala de emergencias.  Si tiene una emergencia mdica, por favor llame inmediatamente al 911 o vaya a la sala de emergencias.  Nmeros de bper  - Dr. Kowalski: [phone]  - Dra. Moye: [phone]  - Dra. Stewart: [phone]  En caso de inclemencias del tiempo, por favor llame a nuestra lnea principal al [phone] para una actualizacin sobre el estado de cualquier retraso o cierre.  Consejos para la medicacin en dermatologa: Por favor, guarde las cajas en las que vienen los medicamentos de uso tpico para ayudarle a seguir las instrucciones sobre dnde y cmo usarlos. Las farmacias generalmente imprimen las instrucciones del medicamento slo en las cajas y no directamente en los tubos del medicamento.   Si su medicamento es muy caro, por favor, pngase en contacto con nuestra oficina llamando al [phone] y presione la opcin 4 o envenos un mensaje a travs de MyChart.   No podemos decirle cul ser su copago por los medicamentos por adelantado ya que esto es diferente dependiendo de la  cobertura de su seguro. Sin embargo, es posible que podamos encontrar un medicamento sustituto a menor costo o llenar un formulario para que el seguro cubra el medicamento que se considera necesario.   Si se requiere una autorizacin previa para que su compaa de seguros cubra su medicamento, por favor permtanos de 1 a 2 das hbiles para completar este proceso.  Los precios de los medicamentos varan con frecuencia dependiendo del lugar de dnde se surte la receta y alguna farmacias pueden ofrecer precios ms baratos.  El sitio web www.goodrx.com tiene cupones para medicamentos de diferentes farmacias. Los precios aqu no tienen en cuenta lo que podra costar con la ayuda del seguro (puede ser ms barato con su seguro), pero el sitio web puede darle el precio si no utiliz ningn seguro.  - Puede imprimir el cupn correspondiente y llevarlo con su receta a la farmacia.  - Tambin puede pasar por nuestra oficina durante el horario de atencin regular y recoger una tarjeta de cupones de GoodRx.  - Si necesita que su receta se enve electrnicamente a una farmacia diferente, informe a nuestra oficina a travs de [facility] o por telfono llamando al [phone] y presione la opcin 4.  

## 2021-10-20 NOTE — Progress Notes (Unsigned)
   Follow-Up Visit   Subjective  [name] is a 71 y.o. female who presents for the following: Annual Exam (HxDN. No personal Hx of skin cancer. Sister Hx of MM). The patient presents for Total-Body Skin Exam (TBSE) for skin cancer screening and mole check.  The patient has spots, moles and lesions to be evaluated, some may be new or changing and the patient has concerns that these could be cancer.  The following portions of the chart were reviewed this encounter and updated as appropriate:  Tobacco  Allergies  Meds  Problems  Med Hx  Surg Hx  Fam Hx     Review of Systems: No other skin or systemic complaints except as noted in HPI or Assessment and Plan.  Objective  Well appearing patient in no apparent distress; mood and affect are within normal limits.  A full examination was performed including scalp, head, eyes, ears, nose, lips, neck, chest, axillae, abdomen, back, buttocks, bilateral upper extremities, bilateral lower extremities, hands, feet, fingers, toes, fingernails, and toenails. All findings within normal limits unless otherwise noted below.   Assessment & Plan   History of Dysplastic Nevi. 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 - No evidence of recurrence today - Recommend regular full body skin exams - Recommend daily broad spectrum sunscreen SPF 30+ to sun-exposed areas, reapply every 2 hours as needed.  - Call if any new or changing lesions are noted between office visits   Family history of skin cancer - what type(s): Melanoma - who affected: Sister  Lentigines - Scattered tan macules - Due to sun exposure - Benign-appearing, observe - Recommend daily broad spectrum sunscreen SPF 30+ to sun-exposed areas, reapply every 2 hours as needed. - Call for any changes  Seborrheic Keratoses - Stuck-on, waxy, tan-brown papules and/or plaques  - Benign-appearing - Discussed benign etiology and prognosis. - Observe - Call for any changes  Melanocytic  Nevi - Tan-brown and/or pink-flesh-colored symmetric macules and papules - Benign appearing on exam today - Observation - Call clinic for new or changing moles - Recommend daily use of broad spectrum spf 30+ sunscreen to sun-exposed areas.   Hemangiomas - Red papules - Discussed benign nature - Observe - Call for any changes  Actinic Damage - Chronic condition, secondary to cumulative UV/sun exposure - diffuse scaly erythematous macules with underlying dyspigmentation - Recommend daily broad spectrum sunscreen SPF 30+ to sun-exposed areas, reapply every 2 hours as needed.  - Staying in the shade or wearing long sleeves, sun glasses (UVA+UVB protection) and wide brim hats (4-inch brim around the entire circumference of the hat) are also recommended for sun protection.  - Call for new or changing lesions.  Skin cancer screening performed today.   Return in about 1 year (around 10/21/2022) for TBSE, HxDN, FmHx MM.  I, Emelia Salisbury, CMA, am acting as scribe for Sarina Ser, MD. Documentation: I have reviewed the above documentation for accuracy and completeness, and I agree with the above.  Sarina Ser, MD

## 2021-10-21 ENCOUNTER — Encounter: Payer: Self-pay | Admitting: Dermatology

## 2021-10-21 ENCOUNTER — Other Ambulatory Visit: Payer: Self-pay | Admitting: Physician Assistant

## 2021-10-21 ENCOUNTER — Ambulatory Visit
Admission: RE | Admit: 2021-10-21 | Discharge: 2021-10-21 | Disposition: A | Discharge status: Home / Self Care | Payer: Medicare Other | Source: Ambulatory Visit | Attending: Physician Assistant | Admitting: Physician Assistant

## 2021-10-21 DIAGNOSIS — R519 Headache, unspecified: Secondary | ICD-10-CM | POA: Diagnosis present

## 2021-10-22 ENCOUNTER — Other Ambulatory Visit: Payer: Self-pay | Admitting: Physician Assistant

## 2021-10-22 DIAGNOSIS — R519 Headache, unspecified: Secondary | ICD-10-CM

## 2022-09-17 ENCOUNTER — Ambulatory Visit (INDEPENDENT_AMBULATORY_CARE_PROVIDER_SITE_OTHER): Payer: Medicare Other | Admitting: Dermatology

## 2022-09-17 VITALS — BP 162/89 | HR 86

## 2022-09-17 DIAGNOSIS — Z7189 Other specified counseling: Secondary | ICD-10-CM

## 2022-09-17 DIAGNOSIS — W908XXA Exposure to other nonionizing radiation, initial encounter: Secondary | ICD-10-CM

## 2022-09-17 DIAGNOSIS — L910 Hypertrophic scar: Secondary | ICD-10-CM

## 2022-09-17 DIAGNOSIS — Z79899 Other long term (current) drug therapy: Secondary | ICD-10-CM

## 2022-09-17 DIAGNOSIS — B353 Tinea pedis: Secondary | ICD-10-CM

## 2022-09-17 DIAGNOSIS — L700 Acne vulgaris: Secondary | ICD-10-CM

## 2022-09-17 DIAGNOSIS — L578 Other skin changes due to chronic exposure to nonionizing radiation: Secondary | ICD-10-CM

## 2022-09-17 DIAGNOSIS — D229 Melanocytic nevi, unspecified: Secondary | ICD-10-CM

## 2022-09-17 DIAGNOSIS — L814 Other melanin hyperpigmentation: Secondary | ICD-10-CM

## 2022-09-17 DIAGNOSIS — L821 Other seborrheic keratosis: Secondary | ICD-10-CM

## 2022-09-17 DIAGNOSIS — Z1283 Encounter for screening for malignant neoplasm of skin: Secondary | ICD-10-CM

## 2022-09-17 DIAGNOSIS — Z86018 Personal history of other benign neoplasm: Secondary | ICD-10-CM

## 2022-09-17 DIAGNOSIS — D1801 Hemangioma of skin and subcutaneous tissue: Secondary | ICD-10-CM

## 2022-09-17 DIAGNOSIS — Z808 Family history of malignant neoplasm of other organs or systems: Secondary | ICD-10-CM

## 2022-09-17 MED ORDER — KETOCONAZOLE 2 % EX CREA: TOPICAL_CREAM | CUTANEOUS | 6 refills | Status: AC

## 2022-09-17 NOTE — Patient Instructions (Signed)

## 2022-09-17 NOTE — Progress Notes (Signed)
   Follow-Up Visit   Subjective  [name] is a 72 y.o. female who presents for the following: Skin Cancer Screening and Full Body Skin Exam The patient presents for Total-Body Skin Exam (TBSE) for skin cancer screening and mole check. The patient has spots, moles and lesions to be evaluated, some may be new or changing and the patient may have concern these could be cancer.  The following portions of the chart were reviewed this encounter and updated as appropriate: medications, allergies, medical history  Review of Systems:  No other skin or systemic complaints except as noted in HPI or Assessment and Plan.  Objective  Well appearing patient in no apparent distress; mood and affect are within normal limits.  A full examination was performed including scalp, head, eyes, ears, nose, lips, neck, chest, axillae, abdomen, back, buttocks, bilateral upper extremities, bilateral lower extremities, hands, feet, fingers, toes, fingernails, and toenails. All findings within normal limits unless otherwise noted below.   Relevant physical exam findings are noted in the Assessment and Plan.    Assessment & Plan   SKIN CANCER SCREENING PERFORMED TODAY.  ACTINIC DAMAGE - Chronic condition, secondary to cumulative UV/sun exposure - diffuse scaly erythematous macules with underlying dyspigmentation - Recommend daily broad spectrum sunscreen SPF 30+ to sun-exposed areas, reapply every 2 hours as needed.  - Staying in the shade or wearing long sleeves, sun glasses (UVA+UVB protection) and wide brim hats (4-inch brim around the entire circumference of the hat) are also recommended for sun protection.  - Call for new or changing lesions.  LENTIGINES, SEBORRHEIC KERATOSES, HEMANGIOMAS - Benign normal skin lesions - Benign-appearing - Call for any changes  MELANOCYTIC NEVI - Tan-brown and/or pink-flesh-colored symmetric macules and papules - Benign appearing on exam today - Observation - Call  clinic for new or changing moles - Recommend daily use of broad spectrum spf 30+ sunscreen to sun-exposed areas.   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 Chronic and persistent condition with duration or expected duration over one year. Condition is symptomatic / bothersome to patient. Not to goal. Treatment Plan: Start Ketoconazole cream apply to feet at bedtime     KELOID- HISTORY OF DYSPLASTIC NEVUS Scar on the right scapula  Observe    History of Dysplastic Nevi. Right scapula, moderate, 2021. Abdomen. - No evidence of recurrence today - Recommend regular full body skin exams - Recommend daily broad spectrum sunscreen SPF 30+ to sun-exposed areas, reapply every 2 hours as needed.  - Call if any new or changing lesions are noted between office visits    Family history of skin cancer - what type(s): Melanoma - who affected: Sister  Return in about 1 year (around 09/17/2023) for TBSE, hx of Dysplastic nevus .  IAngelique Holm, CMA, am acting as scribe for Armida Sans, MD .   Documentation: I have reviewed the above documentation for accuracy and completeness, and I agree with the above.  Armida Sans, MD

## 2022-09-21 ENCOUNTER — Ambulatory Visit (INDEPENDENT_AMBULATORY_CARE_PROVIDER_SITE_OTHER): Payer: Medicare Other | Admitting: Dermatology

## 2022-09-21 ENCOUNTER — Encounter: Payer: Self-pay | Admitting: Dermatology

## 2022-09-21 DIAGNOSIS — L821 Other seborrheic keratosis: Secondary | ICD-10-CM

## 2022-09-21 DIAGNOSIS — L820 Inflamed seborrheic keratosis: Secondary | ICD-10-CM | POA: Diagnosis not present

## 2022-09-21 DIAGNOSIS — W908XXA Exposure to other nonionizing radiation, initial encounter: Secondary | ICD-10-CM | POA: Diagnosis not present

## 2022-09-21 DIAGNOSIS — L578 Other skin changes due to chronic exposure to nonionizing radiation: Secondary | ICD-10-CM | POA: Diagnosis not present

## 2022-09-21 NOTE — Progress Notes (Signed)
   Follow-Up Visit   Subjective  [name] is a 72 y.o. female who presents for the following: spot at right thigh. Patient was here last week for TBSE and forgot to mention a spot that is irritating for her.   The patient has spots, moles and lesions to be evaluated, some may be new or changing and the patient may have concern these could be cancer.   The following portions of the chart were reviewed this encounter and updated as appropriate: medications, allergies, medical history  Review of Systems:  No other skin or systemic complaints except as noted in HPI or Assessment and Plan.  Objective  Well appearing patient in no apparent distress; mood and affect are within normal limits.   A focused examination was performed of the following areas: legs  Relevant exam findings are noted in the Assessment and Plan.    Assessment & Plan   SEBORRHEIC KERATOSIS - Stuck-on, waxy, tan-brown papules and/or plaques  - Benign-appearing - Discussed benign etiology and prognosis. - Observe - Call for any changes    Inflamed seborrheic keratosis (17) R Thigh x 3, R calf x 2, L thigh x 11, L calf x 1  Symptomatic, irritating, patient would like treated.  Benign-appearing.  Call clinic for new or changing lesions.    Destruction of lesion - R Thigh x 3, R calf x 2, L thigh x 11, L calf x 1 (17)  Destruction method: cryotherapy   Informed consent: discussed and consent obtained   Lesion destroyed using liquid nitrogen: Yes   Cryotherapy cycles:  2 Outcome: patient tolerated procedure well with no complications   Post-procedure details: wound care instructions given    ACTINIC DAMAGE - chronic, secondary to cumulative UV radiation exposure/sun exposure over time - diffuse scaly erythematous macules with underlying dyspigmentation - Recommend daily broad spectrum sunscreen SPF 30+ to sun-exposed areas, reapply every 2 hours as needed.  - Recommend staying in the shade or  wearing long sleeves, sun glasses (UVA+UVB protection) and wide brim hats (4-inch brim around the entire circumference of the hat). - Call for new or changing lesions.   Return for as scheduled, TBSE, Hx Dysplastic Nevi.  Anise Salvo, RMA, am acting as scribe for Armida Sans, MD .   Documentation: I have reviewed the above documentation for accuracy and completeness, and I agree with the above.  Armida Sans, MD

## 2022-09-21 NOTE — Patient Instructions (Signed)

## 2022-09-27 ENCOUNTER — Encounter: Payer: Self-pay | Admitting: Dermatology

## 2022-09-28 ENCOUNTER — Encounter: Payer: Self-pay | Admitting: Dermatology

## 2022-10-01 ENCOUNTER — Telehealth: Payer: Self-pay

## 2022-10-01 NOTE — Telephone Encounter (Signed)
Patient called regarding LN2 sites from 09/21/22 visit with Dr. Gwen Pounds. Patient states she has picked the scabs off and now the area is red. Patient advised to place Vaseline over areas and cover with band aid. Patient to call back to the office if she is not better next week. aw

## 2022-10-19 ENCOUNTER — Telehealth: Payer: Self-pay

## 2022-10-19 NOTE — Telephone Encounter (Signed)
Patient called concerning areas that were treated with LN2 a month ago. She states that areas are improved, but are still pink. No evidence of infection, per patient.  Advised patient that areas treated on the legs take longer to heal and should fade over time. Patient should avoid picking and avoid sun exposure to these areas. Any areas that are still scabbed, she should apply Vaseline and a bandage until healed. If patient is still concerned in a few weeks, she will call back for appointment.

## 2022-10-29 ENCOUNTER — Encounter: Payer: Medicare Other | Admitting: Dermatology

## 2023-03-02 ENCOUNTER — Telehealth: Payer: Self-pay

## 2023-03-02 NOTE — Telephone Encounter (Signed)
Patient called and lm on nurse line concerning bald spots she noticed after coloring her hair. She states she has been taking biotin for years but has not noticed any improvement. Would like to discuss if any treatments could be called in to regrow hair.   Called patient and she discussed her concerns. Patient was advised we would need to evaluate areas before sending treatments in. Offered and scheduled an appointment to address her concerns with bald spots. Patient expressed appreciation.

## 2023-04-19 ENCOUNTER — Ambulatory Visit: Payer: Medicare Other | Admitting: Dermatology

## 2023-10-13 ENCOUNTER — Ambulatory Visit (INDEPENDENT_AMBULATORY_CARE_PROVIDER_SITE_OTHER): Payer: Medicare Other | Admitting: Dermatology

## 2023-10-13 DIAGNOSIS — D485 Neoplasm of uncertain behavior of skin: Secondary | ICD-10-CM | POA: Diagnosis not present

## 2023-10-13 DIAGNOSIS — W908XXA Exposure to other nonionizing radiation, initial encounter: Secondary | ICD-10-CM

## 2023-10-13 DIAGNOSIS — L821 Other seborrheic keratosis: Secondary | ICD-10-CM

## 2023-10-13 DIAGNOSIS — D2272 Melanocytic nevi of left lower limb, including hip: Secondary | ICD-10-CM | POA: Diagnosis not present

## 2023-10-13 DIAGNOSIS — Z1283 Encounter for screening for malignant neoplasm of skin: Secondary | ICD-10-CM

## 2023-10-13 DIAGNOSIS — L578 Other skin changes due to chronic exposure to nonionizing radiation: Secondary | ICD-10-CM

## 2023-10-13 DIAGNOSIS — D229 Melanocytic nevi, unspecified: Secondary | ICD-10-CM

## 2023-10-13 DIAGNOSIS — D489 Neoplasm of uncertain behavior, unspecified: Secondary | ICD-10-CM

## 2023-10-13 DIAGNOSIS — Z86018 Personal history of other benign neoplasm: Secondary | ICD-10-CM

## 2023-10-13 DIAGNOSIS — L814 Other melanin hyperpigmentation: Secondary | ICD-10-CM

## 2023-10-13 DIAGNOSIS — Z808 Family history of malignant neoplasm of other organs or systems: Secondary | ICD-10-CM

## 2023-10-13 NOTE — Progress Notes (Unsigned)
   Follow-Up Visit   Subjective  [name] is a 73 y.o. female who presents for the following: Skin Cancer Screening and Full Body Skin Exam  Hx of DN 10/04/19 Right shoulder-moderate atypia  The patient presents for Total-Body Skin Exam (TBSE) for skin cancer screening and mole check. The patient has spots, moles and lesions to be evaluated, some may be new or changing and the patient may have concern these could be cancer.  The following portions of the chart were reviewed this encounter and updated as appropriate: medications, allergies, medical history  Review of Systems:  No other skin or systemic complaints except as noted in HPI or Assessment and Plan.  Objective  Well appearing patient in no apparent distress; mood and affect are within normal limits.  A full examination was performed including scalp, head, eyes, ears, nose, lips, neck, chest, axillae, abdomen, back, buttocks, bilateral upper extremities, bilateral lower extremities, hands, feet, fingers, toes, fingernails, and toenails. All findings within normal limits unless otherwise noted below.   Relevant physical exam findings are noted in the Assessment and Plan.  Left Thigh - Anterior 0.3 irregular dark brown macule  Assessment & Plan   SKIN CANCER SCREENING PERFORMED TODAY.  ACTINIC DAMAGE - Chronic condition, secondary to cumulative UV/sun exposure - diffuse scaly erythematous macules with underlying dyspigmentation - Recommend daily broad spectrum sunscreen SPF 30+ to sun-exposed areas, reapply every 2 hours as needed.  - Staying in the shade or wearing long sleeves, sun glasses (UVA+UVB protection) and wide brim hats (4-inch brim around the entire circumference of the hat) are also recommended for sun protection.  - Call for new or changing lesions.  LENTIGINES, SEBORRHEIC KERATOSES, HEMANGIOMAS - Benign normal skin lesions - Benign-appearing - Call for any changes  MELANOCYTIC NEVI - Tan-brown and/or  pink-flesh-colored symmetric macules and papules - Benign appearing on exam today - Observation - Call clinic for new or changing moles - Recommend daily use of broad spectrum spf 30+ sunscreen to sun-exposed areas.   History of Dysplastic Nevi. Right scapula, moderate, 2021. Abdomen.  - No evidence of recurrence today - Recommend regular full body skin exams - Recommend daily broad spectrum sunscreen SPF 30+ to sun-exposed areas, reapply every 2 hours as needed.  - Call if any new or changing lesions are noted between office visits  Family history of skin cancer - what type(s): Melanoma - who affected: Sister  NEOPLASM OF UNCERTAIN BEHAVIOR Left Thigh - Anterior Epidermal / dermal shaving  Lesion diameter (cm):  0.3 Informed consent: discussed and consent obtained   Patient was prepped and draped in usual sterile fashion: area prepped with alcohol. Anesthesia: the lesion was anesthetized in a standard fashion   Anesthetic:  1% lidocaine w/ epinephrine 1-100,000 buffered w/ 8.4% NaHCO3 Instrument used: flexible razor blade   Hemostasis achieved with: pressure, aluminum chloride and electrodesiccation   Outcome: patient tolerated procedure well   Post-procedure details: wound care instructions given   Post-procedure details comment:  Ointment and small bandage applied  Specimen 1 - Surgical pathology Differential Diagnosis: rule out dn  Check Margins: No Return in about 1 year (around 10/12/2024) for TBSE.  I, Gordan Beams, CMA, am acting as scribe for Alm Rhyme, MD.   Documentation: I have reviewed the above documentation for accuracy and completeness, and I agree with the above.  Alm Rhyme, MD

## 2023-10-14 ENCOUNTER — Encounter: Payer: Self-pay | Admitting: Dermatology

## 2023-10-14 LAB — SURGICAL PATHOLOGY

## 2023-10-15 ENCOUNTER — Ambulatory Visit: Payer: Self-pay | Admitting: Dermatology

## 2023-10-18 ENCOUNTER — Encounter: Payer: Self-pay | Admitting: Dermatology

## 2023-10-18 NOTE — Telephone Encounter (Signed)
-----   Message from Alm Rhyme sent at 10/15/2023  2:06 PM EDT ----- FINAL DIAGNOSIS        1. Skin, left thigh - anterior :       DYSPLASTIC COMPOUND NEVUS WITH MODERATE ATYPIA, LIMITED MARGINS FREE   Moderate dysplastic Recheck next visit ----- Message ----- From: Interface, [facility] Sent: 10/14/2023   9:42 PM EDT To: Alm JAYSON Rhyme, MD

## 2023-10-18 NOTE — Telephone Encounter (Signed)
 Patient advised of BX results. aw

## 2023-10-18 NOTE — Telephone Encounter (Signed)
 LM on VM please return my call   left thigh - anterior :       DYSPLASTIC COMPOUND NEVUS WITH MODERATE ATYPIA, LIMITED MARGINS FREE

## 2023-10-29 NOTE — Progress Notes (Signed)
" °  Subjective:    Patient ID: [name] is a 73 y.o. female here for a Flu Vaccine visit.     Are you sick today?: No  Have you ever had a serious reaction to any vaccine in the past?: No (FlowSheet update)  Have you felt dizzy or faint before, during or after an immunization?: No (FlowSheet update)  Does the patient agree to be seated in a chair with arms or lie on the exam table?: Yes Does the patient agree to be observed for 15 minutes post-procedure to assess for reaction or fainting risk?: No Do you have any concerns receiving a vaccine in either arm (history of shoulder injury, mastectomy or other surgery?): No   Any patient-supplied  information was reviewed and discussed with the patient. : One[place] as reviewed.  Has the VIS been reviewed? Please offer a paper copy of the VIS. Patient may decline: Yes (FlowSheet update)      Lifestyle: [place] has no history on file for tobacco use.      Objective:    Assessment/Plan:   Vaccine administered in accordance with [facility] guidelines.   Patient advised to contact [facility] if adverse event occurs.    [id number] "

## 2023-11-12 NOTE — Progress Notes (Signed)
 Large Joint Injection: R knee  Date/Time: 11/12/2023 10:40 AM  Performed by: Bary Rosina Reagin, PA Authorized by: Bary Rosina Reagin, PA   Needle Size:  22 G Location:  Knee Site:  R knee Medications:  40 mg triamcinolone  acetonide 40 mg/mL; 7 mL BUPivacaine (PF) 0.25 %  HPI:  Patient returns to clinic today for cortisone injection into the knee.  The pain in the joint has become significant enough to warrant injectional therapy. Past Medical, Surgical and Social History:  Past Medical History: No date: Coronary artery disease     Comment:  [facility] - Dr. Stark 3-4 years ago: Gout, joint No date: History of diverticulitis     Comment:  last episode August 2019 No date: Hx of esophageal spasm     Comment:  Intermittent, usually with very hot or cold food/liquids No date: Hyperlipidemia No date: Hypertension No date: Hyperuricemia No date: Hypothyroid No date: Inflammatory arthritis     Comment:  a) low positive rheumatoid factor, positive sed rate, b)              left shoulder capsulitis, c) left trochanteric bursitis No date: Insomnia No date: Migraines No date: Obesity No date: Osteoarthritis No date: Partial tear of right rotator cuff     Comment:  April 2020 supraspinatus  ?15 years ago: Rheumatoid arthritis (CMS/HHS-HCC) No date: Sleep apnea     Comment:  Uses CPAP please bring DOS 09/29/2022: Stage 3a chronic kidney disease (CMS-HCC) 02/08/2023: Statin intolerance     Comment:  Abnormal LFTs No date: Wears glasses  Past Surgical History: 2010: CARDIAC CATH 2010: CARDIAC CATHETERIZATION     Comment:  negative 10/11/2017: ESOPHAGOGASTRODOUDENOSCOPY W/BIOPSY; N/A     Comment:  Procedure: Upper Endoscopy;  Surgeon: Tobie Kipper, MD;                Location: [facility];  Service:               Gastroenterology;  Laterality: N/A; 10/11/2017: COLONOSCOPY; N/A     Comment:  Procedure: COLONOSCOPY;  Surgeon: Tobie Kipper, MD;                 Location: [facility];  Service:               Gastroenterology;  Laterality: N/A; 01/03/2019: EXTRACTION CATARACT EXTRACAPSULAR W/INSERTION INTRAOCULAR  PROSTHESIS; Left     Comment:  Procedure: cataract extraction with intraocular lens               implantation, left eye;  Surgeon: Charlanne Peggy Rakers,              MD;  Location: [facility];  Service:               Ophthalmology;  Laterality: Left; 02/28/2019: EXTRACTION CATARACT EXTRACAPSULAR W/INSERTION INTRAOCULAR  PROSTHESIS; Right     Comment:  Procedure: cataract extraction with intraocular lens               implantation, right eye;  Surgeon: Charlanne Peggy Rakers, MD;  Location: [facility];  Service:               Ophthalmology;  Laterality: Right; 10/09/2022: CONIZATION CERVIX BY COLD KNIFE; N/A     Comment:  Procedure: CONIZATION OF CERVIX, COLD KNIFE OR LASER;  WITH OR WITHOUT FULGURATION, WITH OR WITHOUT DILATION AND              CURETTAGE, WITH OR WITHOUT REPAIR;  Surgeon: MacLaurin,               Nancy Ann, MD;  Location: [facility];  Service: Gynecology;               Laterality: N/A; No date: DILATION AND CURETTAGE, DIAGNOSTIC / THERAPEUTIC     Comment:  Per patient  about 4 months ago. No date: EYE SURGERY; Left     Comment:  cataracts No date: SKIN BIOPSY     Comment:  mole bx - basil cell  Review of patient's family history indicates: Problem: Myocardial Infarction (Heart attack)     Relation: Mother         Age of Onset: (Not Specified) Problem: Coronary Artery Disease (Blocked arteries around heart)     Relation: Mother         Age of Onset: (Not Specified) Problem: Breast cancer     Relation: Mother         Age of Onset: (Not Specified) Problem: Heart disease     Relation: Mother         Age of Onset: (Not Specified) Problem: Rheum arthritis     Relation: Mother         Age of Onset: (Not Specified) Problem: Myocardial Infarction (Heart  attack)     Relation: Father         Age of Onset: (Not Specified) Problem: Coronary Artery Disease (Blocked arteries around heart)     Relation: Father         Age of Onset: (Not Specified) Problem: Gout     Relation: Father         Age of Onset: (Not Specified) Problem: Heart disease     Relation: Father         Age of Onset: (Not Specified) Problem: Hyperlipidemia (Elevated cholesterol)     Relation: Father         Age of Onset: (Not Specified) Problem: Prostate cancer     Relation: Father         Age of Onset: (Not Specified) Problem: Diabetes type II     Relation: Sister         Age of Onset: (Not Specified) Problem: High blood pressure (Hypertension)     Relation: Sister         Age of Onset: (Not Specified) Problem: Hyperlipidemia (Elevated cholesterol)     Relation: Sister         Age of Onset: (Not Specified) Problem: High blood pressure (Hypertension)     Relation: Sister         Age of Onset: (Not Specified) Problem: Hyperlipidemia (Elevated cholesterol)     Relation: Sister         Age of Onset: (Not Specified) Problem: Sleep apnea     Relation: Sister         Age of Onset: (Not Specified) Problem: Hyperlipidemia (Elevated cholesterol)     Relation: Brother         Age of Onset: (Not Specified) Problem: Alcohol abuse     Relation: Maternal Uncle         Age of Onset: (Not Specified) Problem: Colon cancer     Relation: Maternal Grandmother         Age of Onset: (Not Specified) Problem: Myocardial Infarction (Heart attack)     Relation:  Other         Age of Onset: (Not Specified)         Comment: Sibling Problem: Coronary Artery Disease (Blocked arteries around heart)     Relation: Other         Age of Onset: (Not Specified)         Comment: Uncle Problem: Anesthesia problems     Relation: Neg Hx         Age of Onset: (Not Specified) Problem: Malignant hypertension     Relation: Neg Hx         Age of Onset: (Not Specified)   Social History    Socioeconomic History     Marital status: Married     Spouse name: Kamala Kolton     Number of children: 3   Occupational History     Occupation: retired   Tobacco Use     Smoking status: Never     Smokeless tobacco: Never   Vaping Use     Vaping status: Never Used   Substance and Sexual Activity     Alcohol use: No       Alcohol/week: 0.0 standard drinks of alcohol     Drug use: Never     Sexual activity: Not Currently       Partners: Male       Birth control/protection: Post-menopausal   Social History Narrative     Retired.  Social Drivers of Conservation Officer, Historic Buildings Strain: Low Risk  (04/28/2023)   Overall Financial Resource Strain (CARDIA)     Difficulty of Paying Living Expenses: Not hard at all Food Insecurity: No Food Insecurity (04/28/2023)   Hunger Vital Sign     Worried About Running Out of Food in the Last Year: Never true     Ran Out of Food in the Last Year: Never true Transportation Needs: No Transportation Needs (04/28/2023)   PRAPARE - Therapist, Art (Medical): No     Lack of Transportation (Non-Medical): No Physical Activity: Inactive (10/06/2017)   Exercise Vital Sign     Days of Exercise per Week: 0 days     Minutes of Exercise per Session: 0 min Stress: No Stress Concern Present (10/06/2017)   [employer] of Occupational Health - Occupational Stress Questionnaire     Feeling of Stress : Not at all Social Connections: Unknown (10/06/2017)   Social Connection and Isolation Panel     Frequency of Communication with Friends and Family: Patient declined     Frequency of Social Gatherings with Friends and Family: Patient declined     Attends Religious Services: Patient declined     Database Administrator or Organizations: Patient declined     Attends Banker Meetings: Patient declined     Marital Status: Patient declined Housing Stability: Low Risk   (04/28/2023)   Housing Stability Vital Sign     Unable to Pay for Housing in the Last Year: No     Number of Times Moved in the Last Year: 0     Homeless in the Last Year: No  Current Outpatient Medications: acetaminophen  (TYLENOL ) 500 MG tablet, Take by mouth, Disp: , Rfl:  aspirin  81 MG EC tablet, Take 81 mg by mouth every morning No cardiac stent  , Disp: , Rfl:  azelastine (ASTELIN) 137 mcg nasal spray, Place 1 spray into both nostrils 2 (two) times daily (Patient taking differently: Place 1 spray into both nostrils at bedtime as  needed As needed), Disp: 30 mL, Rfl: 11 biotin 10,000 mcg Cap, Take 1 tablet by mouth every morning, Disp: , Rfl:  carvediloL (COREG) 6.25 MG tablet, Take 1 tablet (6.25 mg total) by mouth 2 (two) times daily with meals, Disp: 60 tablet, Rfl: 11 cholecalciferol  (VITAMIN D3) 2,000 unit tablet, Take 2,000 Units by mouth every morning   , Disp: , Rfl:  citalopram (CELEXA) 20 MG tablet, Take 1 tablet (20 mg total) by mouth once daily, Disp: 90 tablet, Rfl: 3 cyanocobalamin (VITAMIN B12) 1000 MCG tablet, Take 1,000 mcg by mouth once daily, Disp: , Rfl:  diazePAM  (VALIUM ) 10 MG tablet, Take 1 tablet (10 mg total) by mouth at bedtime as needed for Anxiety, Disp: 90 tablet, Rfl: 1 estradioL (ESTRACE) 0.01 % (0.1 mg/gram) vaginal cream, PLACE 1 GRAM VAGINALLY TWICE A WEEK, Disp: 42.5 g, Rfl: 2 ezetimibe (ZETIA) 10 mg tablet, TAKE 1 TABLET BY MOUTH EVERY DAY, Disp: 90 tablet, Rfl: 1 fluticasone propionate (FLONASE) 50 mcg/actuation nasal spray, INSTILL 2 SPRAYS IN EACH NOSTRIL ONCE DAILY (Patient taking differently: Place 2 sprays into both nostrils once daily as needed), Disp: 48 g, Rfl: 1 levothyroxine  (SYNTHROID ) 75 MCG tablet, Take 1 tablet (75 mcg total) by mouth once daily Take on an empty stomach with a glass of water at least 30-60 minutes before breakfast., Disp: 90 tablet, Rfl: 3 olmesartan (BENICAR) 40 MG tablet, Take 1 tablet (40 mg total) by mouth  once daily, Disp: 90 tablet, Rfl: 3 potassium chloride (KLOR-CON) 10 MEQ ER tablet, Take 1 tablet (10 mEq total) by mouth once daily, Disp: 90 tablet, Rfl: 3 pravastatin (PRAVACHOL) 20 MG tablet, Take 1 tablet (20 mg total) by mouth at bedtime, Disp: 90 tablet, Rfl: 3 TORsemide (DEMADEX) 10 MG tablet, Take 1 tablet (10 mg total) by mouth once daily as needed for up to 180 days For weight gain more than 2 pounds in 1 day or 5 lbs in 1 week, Disp: 90 tablet, Rfl: 1 traMADoL (ULTRAM) 50 mg tablet, Take 1 tablet (50 mg total) by mouth 2 (two) times daily as needed for Pain for up to 180 days, Disp: 60 tablet, Rfl: 5  No current facility-administered medications for this visit.    -- Diclofenac -- Anaphylaxis and Shortness Of Breath   --  Hypotension, face edema.  -- Crestor  [Rosuvastatin ] -- Other (See Comments)   --  Transaminitis  -- Paxil [Paroxetine Hcl] -- Dizziness  -- Sulfa (Sulfonamide Antibiotics) -- Itching and Rash  -- Sulfur (Not Sulfa) -- Other (See Comments)   Physical examination: There were no vitals taken for this visit. Unchanged Procedure:  The patient was placed in seated position and gave verbal consent to proceed on with injection.  Skin was prepped with chloroscrub and freeze spray.  An injection of 7 cc of Marcaine 0.25% and 40 mg Kenalog was injected into the right knee using a 22 gauge 1  inch needle.  No complications as a result of the injection. Assessment:  (M25.561,  M25.562,  G89.29) Chronic pain of both knees  (primary encounter diagnosis)   Recommendation:  The patient will return to the clinic as needed for repeat injection or consideration for options for further treatment.  Patient will notify the office with any complications that arise following injection.  No medications were given.

## 2023-11-12 NOTE — Progress Notes (Signed)
 History of Present Illness: Patient returns for follow-up regarding right knee pain.  Previous office notes reviewed.  Patient complains of worsening pain in the knee over the last three days.  Began at the grocery store.     Past Medical, Surgical and Social History:  Past Medical History:  Diagnosis Date   Coronary artery disease    [facility] - Dr. Stark   Gout, joint 3-4 years ago   History of diverticulitis    last episode August 2019   Hx of esophageal spasm    Intermittent, usually with very hot or cold food/liquids   Hyperlipidemia    Hypertension    Hyperuricemia    Hypothyroid    Inflammatory arthritis    a) low positive rheumatoid factor, positive sed rate, b) left shoulder capsulitis, c) left trochanteric bursitis   Insomnia    Migraines    Obesity    Osteoarthritis    Partial tear of right rotator cuff    April 2020 supraspinatus    Rheumatoid arthritis (CMS/HHS-HCC) ?15 years ago   Sleep apnea    Uses CPAP please bring DOS   Stage 3a chronic kidney disease (CMS/HHS-HCC) 09/29/2022   Statin intolerance 02/08/2023   Abnormal LFTs   Wears glasses     Past Surgical History:  Procedure Laterality Date   CARDIAC CATH  2010   CARDIAC CATHETERIZATION  2010   negative   ESOPHAGOGASTRODOUDENOSCOPY W/BIOPSY N/A 10/11/2017   Procedure: Upper Endoscopy;  Surgeon: Tobie Kipper, MD;  Location: [facility];  Service: Gastroenterology;  Laterality: N/A;   COLONOSCOPY N/A 10/11/2017   Procedure: COLONOSCOPY;  Surgeon: Tobie Kipper, MD;  Location: [facility];  Service: Gastroenterology;  Laterality: N/A;   EXTRACTION CATARACT EXTRACAPSULAR W/INSERTION INTRAOCULAR PROSTHESIS Left 01/03/2019   Procedure: cataract extraction with intraocular lens implantation, left eye;  Surgeon: Charlanne Peggy Rakers, MD;  Location: [facility];  Service: Ophthalmology;  Laterality: Left;   EXTRACTION CATARACT EXTRACAPSULAR  W/INSERTION INTRAOCULAR PROSTHESIS Right 02/28/2019   Procedure: cataract extraction with intraocular lens implantation, right eye;  Surgeon: Charlanne Peggy Rakers, MD;  Location: [facility];  Service: Ophthalmology;  Laterality: Right;   CONIZATION CERVIX BY COLD KNIFE N/A 10/09/2022   Procedure: CONIZATION OF CERVIX, COLD KNIFE OR LASER; WITH OR WITHOUT FULGURATION, WITH OR WITHOUT DILATION AND CURETTAGE, WITH OR WITHOUT REPAIR;  Surgeon: MacLaurin, Nancy Ann, MD;  Location: [facility];  Service: Gynecology;  Laterality: N/A;   DILATION AND CURETTAGE, DIAGNOSTIC / THERAPEUTIC     Per patient  about 4 months ago.   EYE SURGERY Left    cataracts   SKIN BIOPSY     mole bx - basil cell    Family History  Problem Relation Age of Onset   Myocardial Infarction (Heart attack) Mother    Coronary Artery Disease (Blocked arteries around heart) Mother    Breast cancer Mother    Heart disease Mother    Rheum arthritis Mother    Myocardial Infarction (Heart attack) Father    Coronary Artery Disease (Blocked arteries around heart) Father    Gout Father    Heart disease Father    Hyperlipidemia (Elevated cholesterol) Father    Prostate cancer Father    Diabetes type II Sister    High blood pressure (Hypertension) Sister    Hyperlipidemia (Elevated cholesterol) Sister    High blood pressure (Hypertension) Sister    Hyperlipidemia (Elevated cholesterol) Sister    Sleep apnea Sister    Hyperlipidemia (Elevated cholesterol)  Brother    Alcohol abuse Maternal Uncle    Colon cancer Maternal Grandmother    Myocardial Infarction (Heart attack) Other        Sibling   Coronary Artery Disease (Blocked arteries around heart) Other        Uncle   Anesthesia problems Neg Hx    Malignant hypertension Neg Hx     Current Outpatient Medications  Medication Sig Dispense Refill   acetaminophen  (TYLENOL ) 500 MG tablet Take by mouth     aspirin  81 MG EC tablet Take 81 mg  by mouth every morning No cardiac stent       azelastine (ASTELIN) 137 mcg nasal spray Place 1 spray into both nostrils 2 (two) times daily (Patient taking differently: Place 1 spray into both nostrils at bedtime as needed As needed) 30 mL 11   biotin 10,000 mcg Cap Take 1 tablet by mouth every morning     carvediloL (COREG) 6.25 MG tablet Take 1 tablet (6.25 mg total) by mouth 2 (two) times daily with meals 60 tablet 11   cholecalciferol  (VITAMIN D3) 2,000 unit tablet Take 2,000 Units by mouth every morning        citalopram (CELEXA) 20 MG tablet Take 1 tablet (20 mg total) by mouth once daily 90 tablet 3   cyanocobalamin (VITAMIN B12) 1000 MCG tablet Take 1,000 mcg by mouth once daily     diazePAM  (VALIUM ) 10 MG tablet Take 1 tablet (10 mg total) by mouth at bedtime as needed for Anxiety 90 tablet 1   estradioL (ESTRACE) 0.01 % (0.1 mg/gram) vaginal cream PLACE 1 GRAM VAGINALLY TWICE A WEEK 42.5 g 2   ezetimibe (ZETIA) 10 mg tablet TAKE 1 TABLET BY MOUTH EVERY DAY 90 tablet 1   fluticasone propionate (FLONASE) 50 mcg/actuation nasal spray INSTILL 2 SPRAYS IN EACH NOSTRIL ONCE DAILY (Patient taking differently: Place 2 sprays into both nostrils once daily as needed) 48 g 1   levothyroxine  (SYNTHROID ) 75 MCG tablet Take 1 tablet (75 mcg total) by mouth once daily Take on an empty stomach with a glass of water at least 30-60 minutes before breakfast. 90 tablet 3   olmesartan (BENICAR) 40 MG tablet Take 1 tablet (40 mg total) by mouth once daily 90 tablet 3   potassium chloride (KLOR-CON) 10 MEQ ER tablet Take 1 tablet (10 mEq total) by mouth once daily 90 tablet 3   pravastatin (PRAVACHOL) 20 MG tablet Take 1 tablet (20 mg total) by mouth at bedtime 90 tablet 3   TORsemide (DEMADEX) 10 MG tablet Take 1 tablet (10 mg total) by mouth once daily as needed for up to 180 days For weight gain more than 2 pounds in 1 day or 5 lbs in 1 week 90 tablet 1   traMADoL (ULTRAM) 50 mg tablet Take 1  tablet (50 mg total) by mouth 2 (two) times daily as needed for Pain for up to 180 days 60 tablet 5   No current facility-administered medications for this visit.    Allergies  Allergen Reactions   Diclofenac Anaphylaxis and Shortness Of Breath    Hypotension, face edema.   Crestor  [Rosuvastatin ] Other (See Comments)    Transaminitis   Paxil [Paroxetine Hcl] Dizziness   Sulfa (Sulfonamide Antibiotics) Itching and Rash   Sulfur (Not Sulfa) Other (See Comments)    There were no vitals taken for this visit.  Knee Examination:  Right Left  Musculoskeletal    Gait  Antalgic   Alignment  Normal  Flexion Normal   Extension Normal   Strength Normal   Ligament Exam Medial and lateral joint line tenderness   Patella Exam Normal   Vascular/Lymphatic Exam Normal   Effusion Moderate   Neurologic Normal       Assessment:    ICD-10-CM  1. Chronic pain of both knees  M25.561   M25.562   G89.29      Plan: We discussed the findings on exam.  She would like to proceed with injection in the knee.  She can continue activities as tolerated.  Followup prn.   Attestation Statement:   I personally performed the service, non-incident to. (WP)   ROSINA LOISE NYHAN, PA

## 2024-10-18 ENCOUNTER — Ambulatory Visit: Admitting: Dermatology
